# Patient Record
Sex: Female | Born: 1940 | Race: White | Hispanic: No
Health system: Southern US, Community
[De-identification: ages and names within clinical notes are randomized; demographics above are authoritative.]

## PROBLEM LIST (undated history)

## (undated) DIAGNOSIS — F0390 Unspecified dementia without behavioral disturbance: Secondary | ICD-10-CM

---

## 2008-04-12 ENCOUNTER — Ambulatory Visit: Payer: Self-pay | Admitting: Diagnostic Radiology

## 2008-04-12 ENCOUNTER — Ambulatory Visit (HOSPITAL_BASED_OUTPATIENT_CLINIC_OR_DEPARTMENT_OTHER): Admission: RE | Admit: 2008-04-12 | Discharge: 2008-04-12 | Payer: Self-pay | Admitting: Family Medicine

## 2008-07-12 ENCOUNTER — Emergency Department (HOSPITAL_BASED_OUTPATIENT_CLINIC_OR_DEPARTMENT_OTHER): Admission: EM | Admit: 2008-07-12 | Discharge: 2008-07-12 | Payer: Self-pay | Admitting: Emergency Medicine

## 2008-07-12 ENCOUNTER — Ambulatory Visit: Payer: Self-pay | Admitting: Diagnostic Radiology

## 2009-03-15 ENCOUNTER — Encounter: Admission: RE | Admit: 2009-03-15 | Discharge: 2009-05-14 | Payer: Self-pay | Admitting: Orthopedic Surgery

## 2011-07-28 DIAGNOSIS — H35372 Puckering of macula, left eye: Secondary | ICD-10-CM | POA: Insufficient documentation

## 2013-01-25 ENCOUNTER — Other Ambulatory Visit: Payer: Self-pay | Admitting: Endocrinology

## 2013-01-25 DIAGNOSIS — E041 Nontoxic single thyroid nodule: Secondary | ICD-10-CM

## 2013-02-08 ENCOUNTER — Other Ambulatory Visit (HOSPITAL_COMMUNITY)
Admission: RE | Admit: 2013-02-08 | Discharge: 2013-02-08 | Disposition: A | Discharge status: Home / Self Care | Payer: Medicare Other | Source: Ambulatory Visit | Attending: Interventional Radiology | Admitting: Interventional Radiology

## 2013-02-08 ENCOUNTER — Ambulatory Visit
Admission: RE | Admit: 2013-02-08 | Discharge: 2013-02-08 | Disposition: A | Discharge status: Home / Self Care | Payer: Medicare Other | Source: Ambulatory Visit | Attending: Endocrinology | Admitting: Endocrinology

## 2013-02-08 DIAGNOSIS — E041 Nontoxic single thyroid nodule: Secondary | ICD-10-CM | POA: Insufficient documentation

## 2013-05-10 DIAGNOSIS — M1712 Unilateral primary osteoarthritis, left knee: Secondary | ICD-10-CM | POA: Insufficient documentation

## 2013-06-16 ENCOUNTER — Ambulatory Visit: Payer: Medicare Other | Attending: Orthopaedic Surgery | Admitting: Physical Therapy

## 2013-06-16 DIAGNOSIS — I10 Essential (primary) hypertension: Secondary | ICD-10-CM | POA: Insufficient documentation

## 2013-06-16 DIAGNOSIS — IMO0001 Reserved for inherently not codable concepts without codable children: Secondary | ICD-10-CM | POA: Insufficient documentation

## 2013-06-16 DIAGNOSIS — M810 Age-related osteoporosis without current pathological fracture: Secondary | ICD-10-CM | POA: Insufficient documentation

## 2013-06-16 DIAGNOSIS — M25569 Pain in unspecified knee: Secondary | ICD-10-CM | POA: Insufficient documentation

## 2013-11-13 DIAGNOSIS — M25552 Pain in left hip: Secondary | ICD-10-CM | POA: Insufficient documentation

## 2013-11-13 DIAGNOSIS — N390 Urinary tract infection, site not specified: Secondary | ICD-10-CM | POA: Insufficient documentation

## 2013-11-13 DIAGNOSIS — F419 Anxiety disorder, unspecified: Secondary | ICD-10-CM | POA: Insufficient documentation

## 2013-11-13 DIAGNOSIS — I10 Essential (primary) hypertension: Secondary | ICD-10-CM | POA: Insufficient documentation

## 2014-03-19 DIAGNOSIS — M5432 Sciatica, left side: Secondary | ICD-10-CM | POA: Insufficient documentation

## 2014-04-17 ENCOUNTER — Ambulatory Visit
Admission: RE | Admit: 2014-04-17 | Discharge: 2014-04-17 | Disposition: A | Discharge status: Home / Self Care | Payer: Medicare Other | Source: Ambulatory Visit | Attending: Physical Medicine and Rehabilitation | Admitting: Physical Medicine and Rehabilitation

## 2014-04-17 ENCOUNTER — Other Ambulatory Visit: Payer: Self-pay | Admitting: Physical Medicine and Rehabilitation

## 2014-04-17 DIAGNOSIS — M542 Cervicalgia: Secondary | ICD-10-CM

## 2014-09-17 DIAGNOSIS — R5383 Other fatigue: Secondary | ICD-10-CM | POA: Insufficient documentation

## 2014-09-17 DIAGNOSIS — F4322 Adjustment disorder with anxiety: Secondary | ICD-10-CM | POA: Insufficient documentation

## 2014-09-17 DIAGNOSIS — N390 Urinary tract infection, site not specified: Secondary | ICD-10-CM | POA: Insufficient documentation

## 2014-10-12 DIAGNOSIS — J300 Vasomotor rhinitis: Secondary | ICD-10-CM | POA: Insufficient documentation

## 2015-01-01 DIAGNOSIS — Z96642 Presence of left artificial hip joint: Secondary | ICD-10-CM | POA: Insufficient documentation

## 2015-05-02 DIAGNOSIS — J301 Allergic rhinitis due to pollen: Secondary | ICD-10-CM | POA: Insufficient documentation

## 2015-06-17 DIAGNOSIS — M810 Age-related osteoporosis without current pathological fracture: Secondary | ICD-10-CM | POA: Insufficient documentation

## 2015-09-02 DIAGNOSIS — R251 Tremor, unspecified: Secondary | ICD-10-CM | POA: Insufficient documentation

## 2015-10-08 DIAGNOSIS — M1711 Unilateral primary osteoarthritis, right knee: Secondary | ICD-10-CM | POA: Insufficient documentation

## 2016-04-20 DIAGNOSIS — M159 Polyosteoarthritis, unspecified: Secondary | ICD-10-CM | POA: Insufficient documentation
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
## 2016-08-18 DIAGNOSIS — R350 Frequency of micturition: Secondary | ICD-10-CM | POA: Insufficient documentation

## 2017-02-02 DIAGNOSIS — N6002 Solitary cyst of left breast: Secondary | ICD-10-CM | POA: Insufficient documentation

## 2017-02-17 DIAGNOSIS — R634 Abnormal weight loss: Secondary | ICD-10-CM | POA: Insufficient documentation

## 2017-02-17 DIAGNOSIS — F330 Major depressive disorder, recurrent, mild: Secondary | ICD-10-CM | POA: Insufficient documentation

## 2017-03-30 DIAGNOSIS — H5461 Unqualified visual loss, right eye, normal vision left eye: Secondary | ICD-10-CM | POA: Insufficient documentation

## 2017-05-03 DIAGNOSIS — I700 Atherosclerosis of aorta: Secondary | ICD-10-CM | POA: Insufficient documentation

## 2017-08-13 DIAGNOSIS — H9190 Unspecified hearing loss, unspecified ear: Secondary | ICD-10-CM | POA: Insufficient documentation

## 2018-12-05 DIAGNOSIS — T8454XA Infection and inflammatory reaction due to internal left knee prosthesis, initial encounter: Secondary | ICD-10-CM | POA: Insufficient documentation

## 2018-12-08 DIAGNOSIS — M00062 Staphylococcal arthritis, left knee: Secondary | ICD-10-CM | POA: Insufficient documentation

## 2019-01-02 DIAGNOSIS — Z96652 Presence of left artificial knee joint: Secondary | ICD-10-CM | POA: Insufficient documentation

## 2019-02-08 DIAGNOSIS — E538 Deficiency of other specified B group vitamins: Secondary | ICD-10-CM | POA: Insufficient documentation

## 2020-12-09 DIAGNOSIS — G301 Alzheimer's disease with late onset: Secondary | ICD-10-CM | POA: Insufficient documentation

## 2020-12-09 DIAGNOSIS — F02B0 Dementia in other diseases classified elsewhere, moderate, without behavioral disturbance, psychotic disturbance, mood disturbance, and anxiety: Secondary | ICD-10-CM | POA: Insufficient documentation

## 2021-04-28 DIAGNOSIS — S72009A Fracture of unspecified part of neck of unspecified femur, initial encounter for closed fracture: Secondary | ICD-10-CM | POA: Insufficient documentation

## 2021-04-29 DIAGNOSIS — N183 Chronic kidney disease, stage 3 unspecified: Secondary | ICD-10-CM | POA: Insufficient documentation

## 2021-06-19 ENCOUNTER — Telehealth: Payer: Self-pay | Admitting: Internal Medicine

## 2021-06-19 NOTE — Telephone Encounter (Signed)
Spoke with patient's son Collier Salina and regarding the Palliative referral/services and all questions were answered and he was in agreement with beginning services.  I have scheduled an In-home Consult for 07/08/21 @ 2:30 PM

## 2021-07-08 ENCOUNTER — Other Ambulatory Visit: Payer: PRIVATE HEALTH INSURANCE | Admitting: Internal Medicine

## 2021-07-08 DIAGNOSIS — R634 Abnormal weight loss: Secondary | ICD-10-CM

## 2021-07-08 DIAGNOSIS — H90A32 Mixed conductive and sensorineural hearing loss, unilateral, left ear with restricted hearing on the contralateral side: Secondary | ICD-10-CM

## 2021-07-08 DIAGNOSIS — F02B4 Dementia in other diseases classified elsewhere, moderate, with anxiety: Secondary | ICD-10-CM

## 2021-07-08 DIAGNOSIS — H5461 Unqualified visual loss, right eye, normal vision left eye: Secondary | ICD-10-CM

## 2021-07-08 DIAGNOSIS — Z515 Encounter for palliative care: Secondary | ICD-10-CM

## 2021-07-08 DIAGNOSIS — G301 Alzheimer's disease with late onset: Secondary | ICD-10-CM

## 2021-07-08 NOTE — Progress Notes (Signed)
[facility] Consult Note Telephone: [phone]  Fax: [phone]   Date of encounter: 07/17/21 7:33 AM PATIENT NAME: Diana Horton [address] [place] [address]   [phone] (home)  DOB: 1940/10/02 MRN: [id number] PRIMARY CARE PROVIDER:    No primary care provider on file.,  No primary provider on file. None  REFERRING PROVIDER:   Veneda Melter, NP [place] [address],  [place] [address] [phone]  RESPONSIBLE PARTY:    Contact Information     Name Relation Home Work Mobile   [place] Son   [phone]   ?,Diana Horton Peter's partner) Other   [phone]   Diana Horton Sister [phone]          I met face to face with patient and family in her home/facility. Palliative Care was asked to follow this patient by consultation request of  Diana Horton, Diana Hawthorne, NP to address advance care planning and complex medical decision making. This is the initial visit.                                     ASSESSMENT AND PLAN / RECOMMENDATIONS:   Advance Care Planning/Goals of Care: Goals include to maximize quality of life and symptom management. Patient/health care surrogate gave his/her permission to discuss.Our advance care planning conversation included a discussion about:    The value and importance of advance care planning  Experiences with loved ones who have been seriously ill or have died  Exploration of personal, cultural or spiritual beliefs that might influence medical decisions  Exploration of goals of care in the event of a sudden injury or illness  Identification  of a healthcare agent  Review and updating or creation of an  advance directive document . Decision not to resuscitate or to de-escalate disease focused treatments due to poor prognosis. CODE STATUS: Loves her cats--has 5 of them.  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   It's  become clear that she cannot stay at home any longer, but the family has tried to keep her home as long as possible.  There's a spot [facility] [employer], but up front money was tied up--has to sell house to do this.  Waiting on final approval for a personal loan instead.  Symptom Management/Plan: 1. 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 Vision loss, right eye -notable issue as well, these sensory impairments also are negatively impacting her cognition  4. Weight loss -doing better with more help in home and meals prepped, should thrive in a facility with three meals per day to go to and snacks  5. Palliative care by specialist -provided education about palliative care role, hospice role -provided support for DIL and explained we can follow pt wherever she is to provide ongoing education  and support -has HCPOA (son) -DIL reported that pt has DNR and MOST already done with PCP, but these were not accessible so not helpful in emergency -these also will need to be provided to faclity  Follow up Palliative Care Visit: Palliative care will continue to follow for complex medical decision making, advance care planning, and clarification of goals. Return at facility so family to notify us of move so follow-up can be scheduled at [facility].  I spent 75 minutes providing this consultation. More than 50% of the time in this  consultation was spent in counseling and care coordination.  PPS: 40%  HOSPICE ELIGIBILITY/DIAGNOSIS: not at this time/Alzheimer's or abnormal weight loss if persists  Chief Complaint: initial palliative consult  HISTORY OF PRESENT ILLNESS:  Diana Horton is a 81 y.o. year old female  with moderate to severe dementia, chronic pain, h/o fall with hip fx in her driveway who lives alone in a home in [place] in [place] .   Before she broke her hip, she was going to [facility] memory care two half days per week--enjoyed it after not wanting to go.  She is resistant to do things.  They're trying to find an ideal placement.  HIstory partially obtained from DIL, Diana Horton.  Her son is here for the summer, but is usually teaching in [place].  Diana Horton lives in [place] and comes tues afternoon and evenings.  Sister comes several times per week like bringing meds and meals.  Paid caregivers also come in to be sure she gets breakfast, dinner, meds (they get her lunch together also before they leave).  Family covers the weekend.  She is not permitted to cook--one of the staff reported on her before to social services so stove and oven disconnected.  Others warm her food.  She has fresh fruit and easy things to get for herself like sandwich foods.  She is eating it on her own.    She has lost weight--was 109 lbs this am.  20 lbs over 4 years.  Appetite is not great.  She had lunch at [facility] those days.    She'd fallen in her driveway, was in a snf for rehab.  Then sent home.  Working on transitioning to a facility.  Her son is guardian of person and attorney is guardian of the property.  She's bothered by being bored.  Feels like things are not pleasurable.  Cats keep her going.    She's not unhappy but not deliriously happy.  At some point, she sat on a swing outside, but Diana Horton says it was not today.  Gets up in am on later side, finds something to eat--banana sandwich this am, feeds the cats,    Usually, she sleeps ok.  Still feels sleepy in the am.  They have cameras in the home to watch.  She does take trazodone for rest.  Goes to be at about 10pm.  8-9am sometimes, but might feed cats earlier and go back to bed.    She does not have pain, but reports distress.  Right hip bothers her some, but she's stayed active.  She had her left knee replaced after covid and it got infected.  It was red, warm and she had a fever, had infection cleaned out and now on abx forever.  ID said doing well.  Knees doing ok.    Has a bm each am.  No regular incontinence but has had some accidents.  Uses depends overnight.  Sam gave  them a MOST to complete ahead of time.  No living will was done when she was capable of decision-making.  HCPOA was not properly notarized.    Audiologist has said that her left ear problem was more processing than hearing.  She turns the tv way up.    Pt has been getting help with getting into the shower and then she bathes herself.  Uses bathroom on own and dresses herself.  Pt mostly goes kitchen to bedroom.  There are area rugs that were removed.    She has some class b chf and also CKD.  Has only been getting PT weekly.  Does not get up and move much at all some days.  Will then get stiff.  If loan gets approved, they may try to move her to memory care this weekend.    She does like to draw, color, watch tv.  Cannot read anymore.    History obtained from review of EMR, discussion with primary team, and interview with family, facility staff/caregiver and/or Ms. Dennie Bible.  I reviewed available labs, medications, imaging, studies and related documents from the EMR.  Records reviewed and summarized above.   ROS General: NAD EYES: denies vision changes ENMT: denies dysphagia Cardiovascular: denies chest pain, denies DOE Pulmonary: denies cough, denies increased SOB Abdomen: endorses good appetite but requires reminders and food ready/prepped for the most part except a few  simple items, denies constipation, endorses continence of bowel GU: denies dysuria, endorses continence of urine MSK:  denies increased weakness,  no recent falls reported, uses rollator but forgets it at times Skin: denies rashes or wounds Neurological: reports some hip pain, denies insomnia Psych: Endorses positive mood Heme/lymph/immuno: denies bruises, abnormal bleeding  Physical Exam: Current and past weights:none recent on file Constitutional: NAD General: frail appearing, thin EYES: anicteric sclera, lids intact, no discharge  ENMT: very hard of hearing with missing hearing aids, oral mucous membranes moist, dentition intact CV: S1S2, RRR, no LE edema Pulmonary: LCTA, no increased work of breathing, no cough, room air Abdomen: intake 50%, normo-active BS + 4 quadrants, soft and non tender, no ascites GU: deferred MSK: sarcopenia, moves all extremities, ambulatory with rollator Skin: warm and dry, no rashes or wounds on visible skin Neuro:  generalized weakness,  cognitive impairment with severe short-term memory loss--minute to minute and excellent recall of past--obsessing over relationship with sister today Psych: non-anxious affect, A and O x 2--to people and place, not time, knows president Hem/lymph/immuno: no widespread bruising  CURRENT PROBLEM LIST:  Patient Active Problem List   Diagnosis Date Noted   Fall 07/17/2021   CKD (chronic kidney disease), stage III ([place]) 04/29/2021   Closed fracture of hip ([place]) 04/28/2021   Moderate late onset Alzheimer's dementia ([place]) 16/10/9602   Folic acid deficiency 54/09/8117   Vitamin B12 deficiency 02/08/2019   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/02/2019   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[place]) 12/08/2018   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[place]) 12/05/2018   Hearing loss 08/13/2017   Aortic atherosclerosis ([place]) 05/03/2017   Vision loss, right eye 03/30/2017   Mild episode of recurrent major depressive disorder  ([place]) 02/17/2017   Weight loss 02/17/2017   Breast cyst, left 02/02/2017   Increased frequency of urination 08/18/2016   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   Primary osteoarthritis of right knee 10/08/2015   Tremor 09/02/2015   Osteoporosis 06/17/2015   Seasonal allergic rhinitis due to pollen 05/02/2015   History of total left hip  replacement 01/01/2015   Vasomotor rhinitis 10/12/2014   Adjustment disorder with anxious mood 09/17/2014   Fatigue 09/17/2014   Recurrent UTI 09/17/2014   Sciatica of left side 03/19/2014   Chronic anxiety 11/13/2013   Arthralgia of left hip 11/13/2013   Essential hypertension 11/13/2013   Lower urinary tract infectious disease 11/13/2013   Degenerative arthritis of left knee 05/10/2013   Epiretinal membrane, 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/28/2011   PAST MEDICAL HISTORY:  Active Ambulatory Problems    Diagnosis Date Noted   Closed fracture of hip ([place]) 04/28/2021   CKD (chronic kidney disease), stage III ([place]) 04/29/2021   Chronic anxiety 11/13/2013   Breast cyst, left 02/02/2017   Arthralgia of left hip 11/13/2013   Aortic atherosclerosis ([place]) 05/03/2017   Adjustment disorder with anxious mood 09/17/2014   Degenerative arthritis of left knee 05/10/2013   Primary osteoarthritis of right knee 10/08/2015   Epiretinal membrane, left eye 07/28/2011   Essential hypertension 11/13/2013   Fall 07/17/2021   Fatigue 16/10/9602   Folic acid deficiency 54/09/8117   Hearing loss 08/13/2017   History of total left hip replacement 01/01/2015   Increased frequency of urination 08/18/2016   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[place]) 12/05/2018   Lower urinary tract infectious disease 11/13/2013   Moderate late onset Alzheimer's dementia ([place]) 12/09/2020   Mild episode of recurrent major depressive disorder ([place]) 02/17/2017   Osteoporosis 06/17/2015   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   Recurrent UTI 09/17/2014    Sciatica of left side 03/19/2014   Seasonal allergic rhinitis due to pollen 05/02/2015   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    ALLERGIES:  Allergies  Allergen Reactions   Other     age     PERTINENT MEDICATIONS:  Outpatient Encounter Medications as of 07/08/2021  Medication Sig   alendronate (FOSAMAX) 70 MG tablet Take by mouth.   cyanocobalamin 1000 MCG tablet Take 1 tablet by mouth daily.   doxycycline (VIBRAMYCIN) 100 MG capsule Take by mouth.   folic acid (FOLVITE) 1 MG tablet Take by mouth.   ipratropium (ATROVENT) 0.06 % nasal spray    memantine (NAMENDA) 5 MG tablet Take by mouth.   traZODone (DESYREL) 50 MG tablet Take by mouth.   acetaminophen (TYLENOL) 500 MG tablet Take by mouth.   cholecalciferol (VITAMIN D) 25 MCG (1000 UNIT) tablet Take by mouth.   LORazepam (ATIVAN) 0.5 MG tablet Take 0.5 mg by mouth every 8 (eight) hours as needed.   No facility-administered encounter medications on file as of 07/08/2021.   Thank you for the opportunity to participate in the care of Ms. Dennie Bible.  The palliative care team will continue to follow. Please call our office at [phone] if we can be of additional assistance.   Hollace Kinnier, DO   COVID-19 PATIENT SCREENING TOOL Asked and negative response unless otherwise noted:  Have you had  symptoms of covid, tested positive or been in contact with someone with symptoms/positive test in the  past 5-10 days? no

## 2021-07-17 ENCOUNTER — Encounter: Payer: Self-pay | Admitting: Internal Medicine

## 2021-07-17 DIAGNOSIS — W19XXXA Unspecified fall, initial encounter: Secondary | ICD-10-CM | POA: Insufficient documentation

## 2021-08-04 ENCOUNTER — Other Ambulatory Visit: Payer: Self-pay

## 2021-08-04 ENCOUNTER — Emergency Department (HOSPITAL_COMMUNITY)
Admission: EM | Admit: 2021-08-04 | Discharge: 2021-08-04 | Disposition: A | Discharge status: Home / Self Care | Payer: Medicare HMO | Attending: Emergency Medicine | Admitting: Emergency Medicine

## 2021-08-04 ENCOUNTER — Emergency Department (HOSPITAL_COMMUNITY): Payer: Medicare HMO

## 2021-08-04 ENCOUNTER — Encounter (HOSPITAL_COMMUNITY): Payer: Self-pay

## 2021-08-04 DIAGNOSIS — M25552 Pain in left hip: Secondary | ICD-10-CM | POA: Diagnosis not present

## 2021-08-04 DIAGNOSIS — N183 Chronic kidney disease, stage 3 unspecified: Secondary | ICD-10-CM | POA: Diagnosis not present

## 2021-08-04 DIAGNOSIS — W19XXXA Unspecified fall, initial encounter: Secondary | ICD-10-CM

## 2021-08-04 DIAGNOSIS — R519 Headache, unspecified: Secondary | ICD-10-CM | POA: Diagnosis not present

## 2021-08-04 DIAGNOSIS — G309 Alzheimer's disease, unspecified: Secondary | ICD-10-CM | POA: Insufficient documentation

## 2021-08-04 DIAGNOSIS — I129 Hypertensive chronic kidney disease with stage 1 through stage 4 chronic kidney disease, or unspecified chronic kidney disease: Secondary | ICD-10-CM | POA: Insufficient documentation

## 2021-08-04 DIAGNOSIS — E041 Nontoxic single thyroid nodule: Secondary | ICD-10-CM

## 2021-08-04 DIAGNOSIS — W01190A Fall on same level from slipping, tripping and stumbling with subsequent striking against furniture, initial encounter: Secondary | ICD-10-CM | POA: Diagnosis not present

## 2021-08-04 NOTE — ED Triage Notes (Signed)
Per reports handed to this RN by EMS patient doesn't appear to be taking blood thinners

## 2021-08-04 NOTE — ED Triage Notes (Signed)
Patient comes from [facility] altered at baseline per EMS. Patient was a witnessed fall, hit head on couch,no reported LOC. EMS reports that the patient is repetitive. No obvious bleeding or deformities. Per EMS vitals are WNL. Patient normally walks with a walker and EMS reports that she tripped over her foot.

## 2021-08-04 NOTE — ED Notes (Signed)
Patient transported to X-ray 

## 2021-08-04 NOTE — Discharge Instructions (Addendum)
We discussed your diagnosis of fall. We discussed the incidental right thyroid lobe nodule. Radiology recommends getting an ultrasound of the thyroid. Please follow-up with your primary care doctor or at the [facility] if you do not currently have a primary care doctor.  Please return to the ED if you develop new or worsening symptoms including but not limited to headache, neck pain, hip pain.

## 2021-08-04 NOTE — ED Notes (Signed)
Patient assisted to the restroom at this time

## 2021-08-04 NOTE — ED Provider Notes (Signed)
[facility] EMERGENCY DEPARTMENT Provider Note   CSN: [id number] Arrival date & time: 08/04/21  1928     History  Chief Complaint  Patient presents with   Diana Horton    Diana Horton is a 81 y.o. female with history of Alzheimer's, osteoporosis, arthritis, hypertension, CKD stage III presents after a witnessed ground-level fall at her nursing facility. Per EMS, the patient tripped while walking, hit her head on the couch, did not lose consciousness, and has no visible bleeding or deformities.  The patient states that she is unsure of the direction of her fall. She is unsure if she lost consciousness.  The patient is complaining of mild pain in her left parietal region and states that she can feel a bruise developing there.  Patient is also complaining of new mild pain in her left hip.  Patient denies having pain in her neck, shoulders, back, chest, abdomen, right hip, or bilateral lower extremities.  The patient is considerably hard of hearing. Patients records do not list any blood thinners.   The history is provided by the patient and the EMS personnel.  Fall Associated symptoms include headaches. Pertinent negatives include no chest pain and no abdominal pain.       Home Medications Prior to Admission medications   Medication Sig Start Date End Date Taking? Authorizing Provider  acetaminophen (TYLENOL) 500 MG tablet Take by mouth.    [provider]  alendronate (FOSAMAX) 70 MG tablet Take by mouth. 05/15/20   [provider]  cholecalciferol (VITAMIN D) 25 MCG (1000 UNIT) tablet Take by mouth.    [provider]  cyanocobalamin 1000 MCG tablet Take 1 tablet by mouth daily. 05/09/19   [provider]  doxycycline (VIBRAMYCIN) 100 MG capsule Take by mouth. 06/24/21   [provider]  folic acid (FOLVITE) 1 MG tablet Take by mouth. 01/16/20   [provider]  ipratropium (ATROVENT) 0.06 % nasal spray  06/20/15    [provider]  LORazepam (ATIVAN) 0.5 MG tablet Take 0.5 mg by mouth every 8 (eight) hours as needed. 05/26/21   [provider]  memantine (NAMENDA) 5 MG tablet Take by mouth. 03/31/21   [provider]  traZODone (DESYREL) 50 MG tablet Take by mouth. 06/09/20   [provider]      Allergies    Other    Review of Systems   Review of Systems  Cardiovascular:  Negative for chest pain.  Gastrointestinal:  Negative for abdominal pain.  Musculoskeletal:  Negative for back pain and neck pain.       Left hip pain.   Neurological:  Positive for headaches. Negative for syncope.    Physical Exam Updated Vital Signs BP (!) 155/65 (BP Location: Right Arm)   Pulse 84   Temp 98 F (36.7 C)   Resp 17   SpO2 100%  Physical Exam HENT:     Head:     Comments: Tenderness to palpation of the scalp in the left parietal region. Bruising of the scalp in the left parietal region without active bleeding. No laceration or open fracture. No deformity.  Eyes:     Extraocular Movements: Extraocular movements intact.     Pupils: Pupils are equal, round, and reactive to light.  Cardiovascular:     Rate and Rhythm: Normal rate and regular rhythm.     Pulses: Normal pulses.     Heart sounds: No murmur heard.    No friction rub. No gallop.  Pulmonary:     Effort: Pulmonary effort is normal.     Breath sounds: No wheezing, rhonchi or rales.  Musculoskeletal:     Cervical back: Normal range of motion. No tenderness.     Comments: Tenderness to palpation of the left hip. No deformities. No tenderness to palpation of bilateral UE or LE. No midline spinal tenderness. No tenderness to palpation of bilateral paraspinal regions.   Neurological:     Mental Status: She is alert.     ED Results / Procedures / Treatments   Labs (all labs ordered are listed, but only abnormal results are displayed) Labs Reviewed - No data to display  EKG None  Radiology No results  found.  Procedures Procedures    Medications Ordered in ED Medications - No data to display  ED Course/ Medical Decision Making/ A&P                           Medical Decision Making Diana Horton is a 81 y.o. female with history of Alzheimer's, osteoporosis, arthritis, hypertension, CKD stage III presents after a witnessed ground-level fall at her nursing facility. Per EMS, the patient did hit her head but did not lose consciousness.  The patient is considerably hard of hearing but was able to answer my questions and tell me where she was hurting. Differential diagnosis includes but is not limited to fracture, hematoma, bruise.  Will order CT of the head and C-spine to assess for acute abnormalities.  Will order x-ray of the left hip to assess for acute abnormalities. Patient discussed with Dr. Stevie Kern.  Amount and/or Complexity of Data Reviewed Radiology: ordered and independent interpretation performed. Decision-making details documented in ED Course.   9:55 PM  Patient's son is here.  The son confirms that the patient is not currently taking blood thinners.  Updated the patient and her son on the plan for imaging.  The patient states that her head pain and hip pain are still mild.  10:00 PM The patient's hip XRAY is negative for acute abnormalities.  Awaiting results of the head CT and CT spine.  11:03 PM  The patients head CT and CT spine are negative for acute abnormalities. CT  of the head/c-spine demonstrates an incidental nodule in the right lobe of the thyroid. Radiologist recommends obtaining an US of the thyroid to follow up. Updated the patient and son on results of imaging. Discussed plan to follow-up with her primary care doctor for her fall and for the thyroid nodule.  Discussed plan to return to the ED should she develop new or worsening symptoms including but not limited to headache, neck pain, hip pain.  Patient and son agree with the plan.        Final  Clinical Impression(s) / ED Diagnoses Final diagnoses:  None    Rx / DC Orders ED Discharge Orders     None         Konnor Jorden, Gaylyn Cheers, MD 08/04/21 2313    Milagros Loll, MD 08/04/21 [phone]

## 2021-09-13 ENCOUNTER — Encounter (HOSPITAL_COMMUNITY): Payer: Self-pay | Admitting: Emergency Medicine

## 2021-09-13 ENCOUNTER — Emergency Department (HOSPITAL_COMMUNITY): Payer: Medicare HMO

## 2021-09-13 ENCOUNTER — Emergency Department (HOSPITAL_COMMUNITY)
Admission: EM | Admit: 2021-09-13 | Discharge: 2021-09-13 | Disposition: A | Discharge status: Home / Self Care | Payer: Medicare HMO | Attending: Emergency Medicine | Admitting: Emergency Medicine

## 2021-09-13 ENCOUNTER — Other Ambulatory Visit: Payer: Self-pay

## 2021-09-13 DIAGNOSIS — W06XXXA Fall from bed, initial encounter: Secondary | ICD-10-CM | POA: Insufficient documentation

## 2021-09-13 DIAGNOSIS — I129 Hypertensive chronic kidney disease with stage 1 through stage 4 chronic kidney disease, or unspecified chronic kidney disease: Secondary | ICD-10-CM | POA: Diagnosis not present

## 2021-09-13 DIAGNOSIS — N390 Urinary tract infection, site not specified: Secondary | ICD-10-CM

## 2021-09-13 DIAGNOSIS — S0990XA Unspecified injury of head, initial encounter: Secondary | ICD-10-CM | POA: Diagnosis present

## 2021-09-13 DIAGNOSIS — S0181XA Laceration without foreign body of other part of head, initial encounter: Secondary | ICD-10-CM | POA: Diagnosis not present

## 2021-09-13 DIAGNOSIS — N189 Chronic kidney disease, unspecified: Secondary | ICD-10-CM | POA: Insufficient documentation

## 2021-09-13 DIAGNOSIS — F0390 Unspecified dementia without behavioral disturbance: Secondary | ICD-10-CM | POA: Diagnosis not present

## 2021-09-13 DIAGNOSIS — Z23 Encounter for immunization: Secondary | ICD-10-CM | POA: Insufficient documentation

## 2021-09-13 DIAGNOSIS — E876 Hypokalemia: Secondary | ICD-10-CM | POA: Insufficient documentation

## 2021-09-13 DIAGNOSIS — M542 Cervicalgia: Secondary | ICD-10-CM | POA: Insufficient documentation

## 2021-09-13 DIAGNOSIS — W19XXXA Unspecified fall, initial encounter: Secondary | ICD-10-CM

## 2021-09-13 LAB — CBC WITH DIFFERENTIAL/PLATELET
Abs Immature Granulocytes: 0.05 10*3/uL (ref 0.00–0.07)
Basophils Absolute: 0 10*3/uL (ref 0.0–0.1)
Basophils Relative: 0 %
Eosinophils Absolute: 0.1 10*3/uL (ref 0.0–0.5)
Eosinophils Relative: 1 %
HCT: 34.4 % — ABNORMAL LOW (ref 36.0–46.0)
Hemoglobin: 11.1 g/dL — ABNORMAL LOW (ref 12.0–15.0)
Immature Granulocytes: 1 %
Lymphocytes Relative: 11 %
Lymphs Abs: 0.7 10*3/uL (ref 0.7–4.0)
MCH: 29.3 pg (ref 26.0–34.0)
MCHC: 32.3 g/dL (ref 30.0–36.0)
MCV: 90.8 fL (ref 80.0–100.0)
Monocytes Absolute: 0.7 10*3/uL (ref 0.1–1.0)
Monocytes Relative: 11 %
Neutro Abs: 5 10*3/uL (ref 1.7–7.7)
Neutrophils Relative %: 76 %
Platelets: 215 10*3/uL (ref 150–400)
RBC: 3.79 MIL/uL — ABNORMAL LOW (ref 3.87–5.11)
RDW: 16.2 % — ABNORMAL HIGH (ref 11.5–15.5)
WBC: 6.6 10*3/uL (ref 4.0–10.5)
nRBC: 0 % (ref 0.0–0.2)

## 2021-09-13 LAB — URINALYSIS, ROUTINE W REFLEX MICROSCOPIC
Bacteria, UA: NONE SEEN
Bilirubin Urine: NEGATIVE
Glucose, UA: 50 mg/dL — AB
Ketones, ur: NEGATIVE mg/dL
Nitrite: NEGATIVE
Protein, ur: 30 mg/dL — AB
Specific Gravity, Urine: 1.013 (ref 1.005–1.030)
pH: 6 (ref 5.0–8.0)

## 2021-09-13 LAB — COMPREHENSIVE METABOLIC PANEL
ALT: 13 U/L (ref 0–44)
AST: 18 U/L (ref 15–41)
Albumin: 4.1 g/dL (ref 3.5–5.0)
Alkaline Phosphatase: 68 U/L (ref 38–126)
Anion gap: 8 (ref 5–15)
BUN: 19 mg/dL (ref 8–23)
CO2: 25 mmol/L (ref 22–32)
Calcium: 9.3 mg/dL (ref 8.9–10.3)
Chloride: 104 mmol/L (ref 98–111)
Creatinine, Ser: 1.13 mg/dL — ABNORMAL HIGH (ref 0.44–1.00)
GFR, Estimated: 49 mL/min — ABNORMAL LOW (ref >60)
Glucose, Bld: 108 mg/dL — ABNORMAL HIGH (ref 70–99)
Potassium: 2.8 mmol/L — ABNORMAL LOW (ref 3.5–5.1)
Sodium: 137 mmol/L (ref 135–145)
Total Bilirubin: 0.8 mg/dL (ref 0.3–1.2)
Total Protein: 6.8 g/dL (ref 6.5–8.1)

## 2021-09-13 MED ORDER — LIDOCAINE-EPINEPHRINE (PF) 2 %-1:200000 IJ SOLN
10.0000 mL | Freq: Once | INTRAMUSCULAR | Status: AC
  Administered 2021-09-13: 10 mL
  Filled 2021-09-13: qty 20

## 2021-09-13 MED ORDER — TETANUS-DIPHTH-ACELL PERTUSSIS 5-2.5-18.5 LF-MCG/0.5 IM SUSY
0.5000 mL | PREFILLED_SYRINGE | Freq: Once | INTRAMUSCULAR | Status: AC
Start: 2021-09-13 — End: 2021-09-13
  Administered 2021-09-13: 0.5 mL via INTRAMUSCULAR
  Filled 2021-09-13: qty 0.5

## 2021-09-13 MED ORDER — POTASSIUM CHLORIDE CRYS ER 20 MEQ PO TBCR
40.0000 meq | EXTENDED_RELEASE_TABLET | Freq: Once | ORAL | Status: AC
  Administered 2021-09-13: 40 meq via ORAL
  Filled 2021-09-13: qty 2

## 2021-09-13 MED ORDER — ACETAMINOPHEN 325 MG PO TABS
650.0000 mg | ORAL_TABLET | Freq: Once | ORAL | Status: AC
  Administered 2021-09-13: 650 mg via ORAL
  Filled 2021-09-13: qty 2

## 2021-09-13 MED ORDER — POTASSIUM CHLORIDE CRYS ER 20 MEQ PO TBCR: 20.0000 meq | EXTENDED_RELEASE_TABLET | Freq: Every day | ORAL | 0 refills | Status: DC

## 2021-09-13 MED ORDER — LIDOCAINE-EPINEPHRINE-TETRACAINE (LET) TOPICAL GEL
3.0000 mL | Freq: Once | TOPICAL | Status: AC
  Administered 2021-09-13: 3 mL via TOPICAL
  Filled 2021-09-13: qty 3

## 2021-09-13 MED ORDER — FOSFOMYCIN TROMETHAMINE 3 G PO PACK
3.0000 g | PACK | Freq: Once | ORAL | Status: AC
Start: 2021-09-13 — End: 2021-09-13
  Administered 2021-09-13: 3 g via ORAL
  Filled 2021-09-13: qty 3

## 2021-09-13 MED ORDER — IBUPROFEN 200 MG PO TABS
400.0000 mg | ORAL_TABLET | Freq: Once | ORAL | Status: AC
  Administered 2021-09-13: 400 mg via ORAL
  Filled 2021-09-13: qty 2

## 2021-09-13 NOTE — ED Provider Notes (Signed)
[facility]-EMERGENCY DEPT Provider Note   CSN: [id number] Arrival date & time: 09/13/21  0041     History  Chief Complaint  Patient presents with   Diana Horton    Diana Horton is a 81 y.o. female.  The history is provided by the patient, the EMS personnel and medical records.  Fall  Diana Horton is a 81 y.o. female who presents to the Emergency Department complaining of fall.  She presents to the emergency department for evaluation of injuries after a fall.  She is a resident at [facility] [employer].  She states that she was getting out of bed to use the bathroom and she slipped and fell, striking her head.  Patient is unsure if she lost consciousness or not.  She complains of pain to her head, neck. She has a history of CKD, hypertension, dementia    Home Medications Prior to Admission medications   Medication Sig Start Date End Date Taking? Authorizing Provider  acetaminophen (TYLENOL) 500 MG tablet Take by mouth.    [provider]  alendronate (FOSAMAX) 70 MG tablet Take by mouth. 05/15/20   [provider]  cholecalciferol (VITAMIN D) 25 MCG (1000 UNIT) tablet Take by mouth.    [provider]  cyanocobalamin 1000 MCG tablet Take 1 tablet by mouth daily. 05/09/19   [provider]  doxycycline (VIBRAMYCIN) 100 MG capsule Take by mouth. 06/24/21   [provider]  folic acid (FOLVITE) 1 MG tablet Take by mouth. 01/16/20   [provider]  ipratropium (ATROVENT) 0.06 % nasal spray  06/20/15   [provider]  LORazepam (ATIVAN) 0.5 MG tablet Take 0.5 mg by mouth every 8 (eight) hours as needed. 05/26/21   [provider]  memantine (NAMENDA) 5 MG tablet Take by mouth. 03/31/21   [provider]  traZODone (DESYREL) 50 MG tablet Take by mouth. 06/09/20   [provider]      Allergies    Other    Review of Systems   Review of Systems  All other systems reviewed and are  negative.   Physical Exam Updated Vital Signs BP (!) 140/93   Pulse 84   Temp (!) 97.5 F (36.4 C) (Oral)   Resp 18   Ht 5\' 2"  (1.575 m)   Wt 43.1 kg   SpO2 98%   BMI 17.38 kg/m  Physical Exam Vitals and nursing note reviewed.  Constitutional:      Appearance: She is well-developed.  HENT:     Head: Normocephalic.     Comments: There is a laceration to the central forehead.  Pupils equal round and reactive, EOMI. Neck:     Comments: No significant midline cervical spine tenderness, c-collar in place. Cardiovascular:     Rate and Rhythm: Normal rate and regular rhythm.     Heart sounds: No murmur heard. Pulmonary:     Effort: Pulmonary effort is normal. No respiratory distress.     Breath sounds: Normal breath sounds.  Abdominal:     Palpations: Abdomen is soft.     Tenderness: There is no abdominal tenderness. There is no guarding or rebound.  Musculoskeletal:     Comments: No significant tenderness to palpation over the hips bilaterally.  There is mild tenderness to palpation over the left knee with flexion extension intact.  Skin:    General: Skin is warm and dry.  Neurological:     Mental Status: She is alert.     Comments:  Oriented to person, place.  Disoriented to year.  5 out of 5 strength in all 4 extremities  Psychiatric:        Behavior: Behavior normal.     ED Results / Procedures / Treatments   Labs (all labs ordered are listed, but only abnormal results are displayed) Labs Reviewed  COMPREHENSIVE METABOLIC PANEL - Abnormal; Notable for the following components:      Result Value   Potassium 2.8 (*)    Glucose, Bld 108 (*)    Creatinine, Ser 1.13 (*)    GFR, Estimated 49 (*)    All other components within normal limits  CBC WITH DIFFERENTIAL/PLATELET - Abnormal; Notable for the following components:   RBC 3.79 (*)    Hemoglobin 11.1 (*)    HCT 34.4 (*)    RDW 16.2 (*)    All other components within normal limits  URINALYSIS, ROUTINE W REFLEX  MICROSCOPIC - Abnormal; Notable for the following components:   Glucose, UA 50 (*)    Hgb urine dipstick SMALL (*)    Protein, ur 30 (*)    Leukocytes,Ua LARGE (*)    All other components within normal limits    EKG EKG Interpretation  Date/Time:  Saturday September 13 2021 01:19:30 EDT Ventricular Rate:  81 PR Interval:  136 QRS Duration: 93 QT Interval:  428 QTC Calculation: 497 R Axis:   -34 Text Interpretation: Sinus rhythm Atrial premature complexes RSR' in V1 or V2, probably normal variant Probable LVH with secondary repol abnrm Borderline prolonged QT interval Confirmed by Tilden Fossa [id number]) on 09/13/2021 2:03:36 AM  Radiology DG Knee Complete 4 Views Left  Result Date: 09/13/2021 CLINICAL DATA:  Status post fall. EXAM: LEFT KNEE - COMPLETE 4+ VIEW COMPARISON:  None Available. 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 No evidence of an acute fracture or dislocation. Soft tissues are unremarkable. 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 Electronically Signed   By: Aram Candela M.D.   On: 09/13/2021 02:18   DG Chest Port 1 View  Result Date: 09/13/2021 CLINICAL DATA:  Status post fall. EXAM: PORTABLE CHEST 1 VIEW COMPARISON:  November 09, 2018 FINDINGS: The heart size and mediastinal contours are within normal limits. There is mild calcification of the aortic arch and tortuosity of the descending thoracic aorta. Low lung volumes are seen. There is no evidence of acute infiltrate, pleural effusion or pneumothorax. Multilevel degenerative changes seen within the mid and lower thoracic spine with moderate severity dextroscoliosis of the lower thoracic and upper lumbar spine. IMPRESSION: Low lung volumes without acute or active cardiopulmonary disease. Electronically Signed   By: Aram Candela M.D.   On: 09/13/2021 02:16   CT  Cervical Spine Wo Contrast  Result Date: 09/13/2021 CLINICAL DATA:  Status post fall. EXAM: CT CERVICAL SPINE WITHOUT CONTRAST TECHNIQUE: Multidetector CT imaging of the cervical spine was performed without intravenous contrast. Multiplanar CT image reconstructions were also generated. RADIATION DOSE REDUCTION: This exam was performed according to the departmental dose-optimization program which includes automated exposure control, adjustment of the mA and/or kV according to patient size and/or use of iterative reconstruction technique. COMPARISON:  August 04, 2021 FINDINGS: Alignment: Normal. Skull base and vertebrae: No acute fracture. No primary bone lesion or focal pathologic process. Soft tissues and spinal canal: No prevertebral fluid or swelling. No visible canal hematoma. Disc levels: Marked severity endplate sclerosis and  marked severity anterior osteophyte formation are seen at the levels of C4-C5, C5-C6 and C6-C7. There is marked severity narrowing of the anterior atlantoaxial articulation. Marked severity intervertebral disc space narrowing is seen at C4-C5, C5-C6 and C6-C7. Mild intervertebral disc space narrowing and mild vacuum disc phenomenon is seen at C3-C4. Bilateral marked severity multilevel facet joint hypertrophy is noted. Upper chest: There is mild biapical scarring and/or atelectasis. Other: None. IMPRESSION: 1. No acute fracture or subluxation of the cervical spine. 2. Marked severity multilevel degenerative changes, as described above. Electronically Signed   By: Aram Candela M.D.   On: 09/13/2021 02:15   CT Head Wo Contrast  Result Date: 09/13/2021 CLINICAL DATA:  Status post fall. EXAM: CT HEAD WITHOUT CONTRAST TECHNIQUE: Contiguous axial images were obtained from the base of the skull through the vertex without intravenous contrast. RADIATION DOSE REDUCTION: This exam was performed according to the departmental dose-optimization program which includes automated exposure  control, adjustment of the mA and/or kV according to patient size and/or use of iterative reconstruction technique. COMPARISON:  August 04, 2021 FINDINGS: Brain: There is mild cerebral atrophy with widening of the extra-axial spaces and ventricular dilatation. There are areas of decreased attenuation within the white matter tracts of the supratentorial brain, consistent with microvascular disease changes. Vascular: No hyperdense vessel or unexpected calcification. Skull: Normal. Negative for fracture or focal lesion. Sinuses/Orbits: No acute finding. Other: There is mild right frontal scalp soft tissue swelling. IMPRESSION: 1. Generalized cerebral atrophy and chronic white matter small vessel ischemic changes without evidence of an acute intracranial abnormality. 2. Mild right frontal scalp soft tissue swelling without evidence of an underlying fracture. Electronically Signed   By: Aram Candela M.D.   On: 09/13/2021 02:12    Procedures Procedures    Medications Ordered in ED Medications  lidocaine-EPINEPHrine-tetracaine (LET) topical gel (3 mLs Topical Given 09/13/21 0250)  lidocaine-EPINEPHrine (XYLOCAINE W/EPI) 2 %-1:200000 (PF) injection 10 mL (10 mLs Infiltration Given by Other 09/13/21 0430)  potassium chloride SA (KLOR-CON M) CR tablet 40 mEq (40 mEq Oral Given 09/13/21 0449)  acetaminophen (TYLENOL) tablet 650 mg (650 mg Oral Given 09/13/21 9379)    ED Course/ Medical Decision Making/ A&P                           Medical Decision Making Amount and/or Complexity of Data Reviewed Labs: ordered. Radiology: ordered.  Risk OTC drugs. Prescription drug management.   Patient here for evaluation of injuries following a fall.  She has a laceration to her forehead-wound repaired per PA note.  CT head is negative for acute abnormality.  CT scan C-spine with severe degenerative changes, no acute fracture or dislocation.  Patient does complain of ongoing neck pain.  Will place in soft collar and  obtain MRI to further evaluate for additional injuries.  CBC with mild anemia.  CMP with stable renal function.  She does have mild hypokalemia-replaced orally.  patient care transferred pending MRI.  Attempted to contact patient's son for an update over the phone-no answer.        Final Clinical Impression(s) / ED Diagnoses Final diagnoses:  Facial laceration, initial encounter  Fall, initial encounter  Neck pain  Hypokalemia    Rx / DC Orders ED Discharge Orders     None         Tilden Fossa, MD 09/13/21 [phone]

## 2021-09-13 NOTE — ED Triage Notes (Signed)
Pt BIB EMS for unwitnessed fall. Pt denies LOC. Per pt, she slipped since she had socks and no shoes on. Pt has lac on her head. Pt c/o neck pain as well.

## 2021-09-13 NOTE — ED Provider Notes (Addendum)
  Physical Exam  BP (!) 140/93   Pulse 84   Temp (!) 97.5 F (36.4 C) (Oral)   Resp 18   Ht 5\' 2"  (1.575 m)   Wt 43.1 kg   SpO2 98%   BMI 17.38 kg/m   Physical Exam  Procedures  Procedures  ED Course / MDM    Medical Decision Making Amount and/or Complexity of Data Reviewed Labs: ordered. Radiology: ordered.  Risk OTC drugs. Prescription drug management.   81 year old female with severe dementia who presents after a fall.  She had a head injury with resulting frontal forehead laceration that was repaired by APP.  Mild hypokalemia 2.8 which was repleted with 40 mEq potassium chloride.  Mild anemia hemoglobin 11.1 with no evidence of bleeding.  UA with 11-20 WBCs and large leukocyte esterase and small hemoglobin, will treat as UTI and give dose of fosfomycin.  Patient had MRI C-spine due to persistent neck pain but no evidence of acute soft tissue injury or cord abnormality just chronic severe cervical stenosis.  She is moving bilateral upper extremities and able to walk but had a limp.  Knee x-ray of the left negative, chest x-ray unremarkable, CT head and C-spine unremarkable.  Will obtain pelvis x-ray due to limp favoring right side.  Pelvis x-ray obtained with no evidence of acute traumatic injury, independently interpreted myself.  I have visualized patient bearing weight on bilateral lower extremities.  No concern for occult fracture. Will discharge with strict return precautions back to facility and short course of potassium.         77, MD 09/13/21 1010    09/15/21, MD 09/13/21 1011

## 2021-09-13 NOTE — ED Notes (Signed)
Assumed pt care with RN oversight, pt respirations equal & unlabored.

## 2021-09-13 NOTE — ED Provider Notes (Signed)
Please see Dr. Brunilda Payor note for full H&P.  Forehead laceration repaired per procedure note below. Procedures  .Marland KitchenLaceration Repair  Date/Time: 09/13/2021 4:41 AM  Performed by: Cherly Anderson, PA-C Authorized by: Cherly Anderson, PA-C   Consent:    Consent obtained:  Verbal   Consent given by:  Patient   Risks, benefits, and alternatives were discussed: yes     Alternatives discussed:  No treatment Anesthesia:    Anesthesia method:  Topical application and local infiltration   Topical anesthetic:  LET   Local anesthetic:  Lidocaine 2% WITH epi Laceration details:    Location:  Scalp   Scalp location:  Frontal   Length (cm):  3   Depth (mm):  4 Pre-procedure details:    Preparation:  Patient was prepped and draped in usual sterile fashion Exploration:    Hemostasis achieved with:  Direct pressure   Contaminated: no   Treatment:    Area cleansed with:  Chlorhexidine   Amount of cleaning:  Standard   Irrigation solution:  Sterile water   Irrigation method:  Syringe   Layers/structures repaired:  Vernona Rieger:    Suture size:  4-0   Suture material:  Vicryl   Suture technique:  Simple interrupted   Number of sutures:  2 Skin repair:    Repair method:  Sutures   Suture size:  5-0   Wound skin closure material used: vicryl rapide.   Suture technique:  Simple interrupted   Number of sutures:  5 Approximation:    Approximation:  Close Repair type:    Repair type:  Intermediate Post-procedure details:    Procedure completion:  Tolerated well, no immediate complications      Cherly Anderson, PA-C 09/13/21 0443    Tilden Fossa, MD 09/14/21 [phone]

## 2021-09-13 NOTE — Discharge Instructions (Signed)
You have been seen in the Emergency Department (ED) today following a fall.  Your workup today did not reveal any injuries that require you to stay in the hospital.  The imaging of your head and neck, pelvis and knee were all reassuring and showed no traumatic injuries.  We also got an MRI of your cervical spine which showed no spinal cord or soft tissue injury.  You can expect to be stiff and sore for the next several days.  Please take Tylenol or Motrin as needed for pain, but only as written on the box.  You did have a UTI which we gave a one-time dose of antibiotics for in the ER.  You should not require any further antibiotics.  Also your potassium was slightly low, take the potassium as prescribed for the next 5 days.  Please follow up with your primary care doctor as soon as possible regarding today's ED visit and your recent accident.   Call your doctor or return to the ED if you develop a sudden or severe headache, confusion, slurred speech, facial droop, weakness or numbness in any arm or leg,  extreme fatigue, vomiting more than two times, severe abdominal pain, difficulty breathing or any other concerning signs or symptoms.

## 2021-09-13 NOTE — ED Notes (Signed)
Pt assisted to bedpan, provided perineal care, new brief applied, repositioned to comfort.

## 2021-09-13 NOTE — ED Notes (Signed)
Pt assisted with bedpan, pt provided perineal care following, barrier ointment applied, new brief applied, repositioned to comfort.

## 2021-09-13 NOTE — ED Notes (Signed)
Small soft C-Collar applied to pt.

## 2021-09-13 NOTE — ED Notes (Signed)
Ambulated pt. Pt was able to get up and sit on side of bed with little to no assistance. Pt was able to ambulate with staff assistance, maintaining steady and equal gait. Pt denied any dizziness, shortness of breath with exertion.

## 2021-11-02 ENCOUNTER — Emergency Department (HOSPITAL_COMMUNITY)
Admission: EM | Admit: 2021-11-02 | Discharge: 2021-11-02 | Disposition: A | Discharge status: Skilled Nursing Facility | Payer: Medicare HMO | Attending: Emergency Medicine | Admitting: Emergency Medicine

## 2021-11-02 ENCOUNTER — Emergency Department (HOSPITAL_COMMUNITY): Payer: Medicare HMO

## 2021-11-02 DIAGNOSIS — Y92002 Bathroom of unspecified non-institutional (private) residence single-family (private) house as the place of occurrence of the external cause: Secondary | ICD-10-CM | POA: Diagnosis not present

## 2021-11-02 DIAGNOSIS — S0990XA Unspecified injury of head, initial encounter: Secondary | ICD-10-CM

## 2021-11-02 DIAGNOSIS — F0390 Unspecified dementia without behavioral disturbance: Secondary | ICD-10-CM | POA: Insufficient documentation

## 2021-11-02 DIAGNOSIS — W19XXXA Unspecified fall, initial encounter: Secondary | ICD-10-CM | POA: Insufficient documentation

## 2021-11-02 DIAGNOSIS — Y9301 Activity, walking, marching and hiking: Secondary | ICD-10-CM | POA: Diagnosis not present

## 2021-11-02 DIAGNOSIS — S0101XA Laceration without foreign body of scalp, initial encounter: Secondary | ICD-10-CM | POA: Diagnosis not present

## 2021-11-02 LAB — I-STAT CHEM 8, ED
BUN: 25 mg/dL — ABNORMAL HIGH (ref 8–23)
Calcium, Ion: 1.08 mmol/L — ABNORMAL LOW (ref 1.15–1.40)
Chloride: 104 mmol/L (ref 98–111)
Creatinine, Ser: 0.8 mg/dL (ref 0.44–1.00)
Glucose, Bld: 92 mg/dL (ref 70–99)
HCT: 31 % — ABNORMAL LOW (ref 36.0–46.0)
Hemoglobin: 10.5 g/dL — ABNORMAL LOW (ref 12.0–15.0)
Potassium: 3.8 mmol/L (ref 3.5–5.1)
Sodium: 138 mmol/L (ref 135–145)
TCO2: 23 mmol/L (ref 22–32)

## 2021-11-02 MED ORDER — ACETAMINOPHEN 500 MG PO TABS
1000.0000 mg | ORAL_TABLET | Freq: Once | ORAL | Status: AC
  Administered 2021-11-02: 1000 mg via ORAL
  Filled 2021-11-02: qty 2

## 2021-11-02 NOTE — ED Provider Notes (Addendum)
[facility] EMERGENCY DEPARTMENT Provider Note   CSN: [id number] Arrival date & time: 11/02/21  [id number]     History  Chief Complaint  Patient presents with   Lytle Michaels    Diana Horton is a 81 y.o. female.  82 y/o female with hx of dementia presents from [facility] s/p unwitnessed fall while walking to bathroom. Patient cannot recall what precipitated her fall. She does not believe she was ambulating with an assist device. The patient is not chronically anticoagulated. EMS placed patient in C-collar. Noted to be bleeding from posterior scalp on their arrival; bleeding now controlled.   The history is provided by the patient. No language interpreter was used.  Fall       Home Medications Prior to Admission medications   Medication Sig Start Date End Date Taking? Authorizing Provider  acetaminophen (TYLENOL) 500 MG tablet Take by mouth.    [provider]  alendronate (FOSAMAX) 70 MG tablet Take by mouth. 05/15/20   [provider]  cholecalciferol (VITAMIN D) 25 MCG (1000 UNIT) tablet Take by mouth.    [provider]  cyanocobalamin 1000 MCG tablet Take 1 tablet by mouth daily. 05/09/19   [provider]  doxycycline (VIBRAMYCIN) 100 MG capsule Take by mouth. 06/24/21   [provider]  folic acid (FOLVITE) 1 MG tablet Take by mouth. 01/16/20   [provider]  ipratropium (ATROVENT) 0.06 % nasal spray  06/20/15   [provider]  LORazepam (ATIVAN) 0.5 MG tablet Take 0.5 mg by mouth every 8 (eight) hours as needed. 05/26/21   [provider]  memantine (NAMENDA) 5 MG tablet Take by mouth. 03/31/21   [provider]  potassium chloride SA (KLOR-CON M) 20 MEQ tablet Take 1 tablet (20 mEq total) by mouth daily for 5 days. 09/13/21 09/18/21  Elgie Congo, MD  traZODone (DESYREL) 50 MG tablet Take by mouth. 06/09/20   [provider]      Allergies    Other    Review of  Systems   Review of Systems  Unable to perform ROS: Dementia    Physical Exam Updated Vital Signs BP (!) 146/90   Pulse 70   Temp 97.9 F (36.6 C) (Oral)   Resp 14   SpO2 100%   Physical Exam Vitals and nursing note reviewed.  Constitutional:      General: She is not in acute distress.    Appearance: She is well-developed. She is not diaphoretic.     Comments: Alert and mildly anxious. Thin, frail appearing.  HENT:     Head: Normocephalic.     Comments: Hematoma to right posterior parietal scalp. No active bleeding. Eyes:     General: No scleral icterus.    Extraocular Movements: Extraocular movements intact.     Conjunctiva/sclera: Conjunctivae normal.  Neck:     Comments: C-collar in place Cardiovascular:     Rate and Rhythm: Normal rate and regular rhythm.     Pulses: Normal pulses.  Pulmonary:     Effort: Pulmonary effort is normal. No respiratory distress.     Breath sounds: No stridor. No wheezing.     Comments: Lungs CTAB. Respirations even and unlabored. Musculoskeletal:        General: Normal range of motion.  Skin:    General: Skin is warm and dry.     Coloration: Skin is not pale.     Findings: No erythema or rash.  Neurological:  Mental Status: She is alert.     Coordination: Coordination normal.     Comments: GCS 15. Moving extremities spontaneously.  Psychiatric:        Behavior: Behavior normal.     ED Results / Procedures / Treatments   Labs (all labs ordered are listed, but only abnormal results are displayed) Labs Reviewed  I-STAT CHEM 8, ED - Abnormal; Notable for the following components:      Result Value   BUN 25 (*)    Calcium, Ion 1.08 (*)    Hemoglobin 10.5 (*)    HCT 31.0 (*)    All other components within normal limits  URINALYSIS, ROUTINE W REFLEX MICROSCOPIC    EKG EKG Interpretation  Date/Time:  Sunday November 02 2021 02:13:15 EDT Ventricular Rate:  64 PR Interval:  136 QRS Duration: 88 QT Interval:  426 QTC  Calculation: 440 R Axis:   -33 Text Interpretation: Sinus rhythm Left axis deviation Abnormal R-wave progression, late transition Borderline T abnormalities, anterior leads Confirmed by Ripley Fraise [phone]) on 11/02/2021 2:39:29 AM  Radiology CT HEAD WO CONTRAST (5MM)  Result Date: 11/02/2021 CLINICAL DATA:  Unwitnessed fall, posterior head injury EXAM: CT HEAD WITHOUT CONTRAST CT CERVICAL SPINE WITHOUT CONTRAST TECHNIQUE: Multidetector CT imaging of the head and cervical spine was performed following the standard protocol without intravenous contrast. Multiplanar CT image reconstructions of the cervical spine were also generated. RADIATION DOSE REDUCTION: This exam was performed according to the departmental dose-optimization program which includes automated exposure control, adjustment of the mA and/or kV according to patient size and/or use of iterative reconstruction technique. COMPARISON:  MRI cervical spine dated 09/13/2021. CT head dated 09/13/2021. FINDINGS: CT HEAD FINDINGS Brain: No evidence of acute infarction, hemorrhage, hydrocephalus, extra-axial collection or mass lesion/mass effect. Age related atrophy. Subcortical white matter and periventricular small vessel ischemic changes. Vascular: No hyperdense vessel or unexpected calcification. Skull: Normal. Negative for fracture or focal lesion. Sinuses/Orbits: The visualized paranasal sinuses are essentially clear. The mastoid air cells are unopacified. Other: Soft tissue laceration with small extracranial hematoma overlying the right parietal bone (coronal image 58). CT CERVICAL SPINE FINDINGS Alignment: Normal cervical lordosis. Skull base and vertebrae: No acute fracture. No primary bone lesion or focal pathologic process. Soft tissues and spinal canal: No prevertebral fluid or swelling. No visible canal hematoma. Disc levels: Moderate degenerative changes of the mid cervical spine. Spinal canal is patent. Upper chest: Visualized lung apices  are notable for biapical pleural-parenchymal scarring. Other: Small bilateral thyroid nodules measuring up to 12 mm on the right (series 5/image 65). Not clinically significant; no follow-up imaging recommended (ref: J Am Coll Radiol. 2015 Feb;12(2): 143-50). IMPRESSION: Soft tissue laceration with small extracranial hematoma overlying the right parietal bone. No evidence of calvarial fracture. No acute intracranial abnormality. Age related atrophy with small vessel ischemic changes. No traumatic injury to the cervical spine. Moderate degenerative changes. Electronically Signed   By: Julian Hy M.D.   On: 11/02/2021 02:51   CT Cervical Spine Wo Contrast  Result Date: 11/02/2021 CLINICAL DATA:  Unwitnessed fall, posterior head injury EXAM: CT HEAD WITHOUT CONTRAST CT CERVICAL SPINE WITHOUT CONTRAST TECHNIQUE: Multidetector CT imaging of the head and cervical spine was performed following the standard protocol without intravenous contrast. Multiplanar CT image reconstructions of the cervical spine were also generated. RADIATION DOSE REDUCTION: This exam was performed according to the departmental dose-optimization program which includes automated exposure control, adjustment of the mA and/or kV according to patient size and/or use of  iterative reconstruction technique. COMPARISON:  MRI cervical spine dated 09/13/2021. CT head dated 09/13/2021. FINDINGS: CT HEAD FINDINGS Brain: No evidence of acute infarction, hemorrhage, hydrocephalus, extra-axial collection or mass lesion/mass effect. Age related atrophy. Subcortical white matter and periventricular small vessel ischemic changes. Vascular: No hyperdense vessel or unexpected calcification. Skull: Normal. Negative for fracture or focal lesion. Sinuses/Orbits: The visualized paranasal sinuses are essentially clear. The mastoid air cells are unopacified. Other: Soft tissue laceration with small extracranial hematoma overlying the right parietal bone (coronal  image 58). CT CERVICAL SPINE FINDINGS Alignment: Normal cervical lordosis. Skull base and vertebrae: No acute fracture. No primary bone lesion or focal pathologic process. Soft tissues and spinal canal: No prevertebral fluid or swelling. No visible canal hematoma. Disc levels: Moderate degenerative changes of the mid cervical spine. Spinal canal is patent. Upper chest: Visualized lung apices are notable for biapical pleural-parenchymal scarring. Other: Small bilateral thyroid nodules measuring up to 12 mm on the right (series 5/image 65). Not clinically significant; no follow-up imaging recommended (ref: J Am Coll Radiol. 2015 Feb;12(2): 143-50). IMPRESSION: Soft tissue laceration with small extracranial hematoma overlying the right parietal bone. No evidence of calvarial fracture. No acute intracranial abnormality. Age related atrophy with small vessel ischemic changes. No traumatic injury to the cervical spine. Moderate degenerative changes. Electronically Signed   By: Julian Hy M.D.   On: 11/02/2021 02:51    Procedures Procedures     LACERATION REPAIR Performed by: Antonietta Breach Authorized by: Antonietta Breach Consent: Verbal consent obtained. Risks and benefits: risks, benefits and alternatives were discussed Consent given by: patient Patient identity confirmed: provided demographic data Prepped and Draped in normal sterile fashion Wound explored  Laceration Location: scalp  Laceration Length: 4cm  No Foreign Bodies seen or palpated  Irrigation method: syringe Amount of cleaning: standard  Skin closure: staples  Number of sutures: 4  Technique: simple  Patient tolerance: Patient tolerated the procedure well with no immediate complications.   Medications Ordered in ED Medications  acetaminophen (TYLENOL) tablet 1,000 mg (1,000 mg Oral Given 11/02/21 0505)    ED Course/ Medical Decision Making/ A&P Clinical Course as of 11/02/21 0603  Sun Nov 02, 2021  0453 Wound cleaned  to reveal 4cm posterior scalp laceration and hematoma. Laceration repaired with staples w/o complication. [KH]    Clinical Course User Index [KH] Antonietta Breach, PA-C                           Medical Decision Making Amount and/or Complexity of Data Reviewed Labs: ordered. Radiology: ordered.  Risk OTC drugs.   This patient presents to the ED for concern of head injury s/p fall, this involves an extensive number of treatment options, and is a complaint that carries with it a high risk of complications and morbidity.  The differential diagnosis includes scalp hematoma vs skull fx vs ICH vs concussion   Co morbidities that complicate the patient evaluation  Dementia    Additional history obtained:  Additional history obtained from EMS, SNF facility External records from outside source obtained and reviewed including prior head CT from 08/2021   Lab Tests:  I Ordered, and personally interpreted labs.  The pertinent results include:  stable H/H, no electrolyte derangements   Imaging Studies ordered:  I ordered imaging studies including CT head and C-spine  I independently visualized and interpreted imaging which showed no acute traumatic pathology I agree with the radiologist interpretation   Cardiac Monitoring:  The patient was maintained on a cardiac monitor.  I personally viewed and interpreted the cardiac monitored which showed an underlying rhythm of: NSR   Medicines ordered and prescription drug management:  I ordered medication including Tylenol for head pain post staple placement  Reevaluation of the patient after these medicines showed that the patient improved I have reviewed the patients home medicines and have made adjustments as needed   Test Considered:  Portable Xray chest, pelvis   Reevaluation:  After the interventions noted above, I reevaluated the patient and found that they have : remained stable   Social Determinants of Health:  Frequent  falls Insured patient   Dispostion:  After consideration of the diagnostic results and the patients response to treatment, I feel that the patent would benefit from use of rolling walker or cane when mobile. Can continue tylenol PRN for pain. Advised return in 1 week for staple removal. Will transport back to SNF via [facility].   Vitals:   11/02/21 0345 11/02/21 0400 11/02/21 0415 11/02/21 0500  BP: (!) 136/92 (!) 151/88 (!) 148/89 (!) 146/90  Pulse: 65 64 69 70  Resp: (!) 24 14 16 14   Temp:      TempSrc:      SpO2: 100% 100% 100% 100%          Final Clinical Impression(s) / ED Diagnoses Final diagnoses:  Fall, initial encounter  Injury of head, initial encounter    Rx / DC Orders ED Discharge Orders     None         Antonietta Breach, PA-C 11/02/21 0611    Antonietta Breach, PA-C 11/02/21 [id number]    Ripley Fraise, MD 11/02/21 [phone]

## 2021-11-02 NOTE — ED Triage Notes (Signed)
Patient arrives with [facility] from [facility] c/o unwitnessed fall while walking to the bathroom. Pt is in C-collar and has "knot on the back of her head" in which was bleeding but is now controlled. Pt has hx of dementia and is a&o to self. Per EMS, patient is not on thinners and had no LOC.  EMS vitals:  146 BP palpated HR 64 16 RR CBG 111

## 2021-11-02 NOTE — ED Notes (Signed)
Patient awake and alert this morning, pleasantly confused, able to verbalize needs, ambulated to the restroom with assistance, will continue to monitor.

## 2021-11-02 NOTE — Discharge Instructions (Addendum)
We recommend that you use a rolling walker or cane when walking to assist your balance and prevent future falls.  Have your staples removed in 1 week.  Take Tylenol as needed for any persistent pain or headache.  Return to the ED for new or concerning symptoms.

## 2021-11-02 NOTE — ED Notes (Signed)
Updated patient's son Collier Salina in [place] at [phone] on patient. Patient's son requested for staff to call him when mother is up for discharge so that he can try and call local family to transport mother back to facility.

## 2021-12-11 ENCOUNTER — Emergency Department (HOSPITAL_COMMUNITY): Payer: Medicare HMO

## 2021-12-11 ENCOUNTER — Emergency Department (HOSPITAL_COMMUNITY)
Admission: EM | Admit: 2021-12-11 | Discharge: 2021-12-11 | Disposition: A | Discharge status: Skilled Nursing Facility | Payer: Medicare HMO | Attending: Emergency Medicine | Admitting: Emergency Medicine

## 2021-12-11 DIAGNOSIS — W19XXXA Unspecified fall, initial encounter: Secondary | ICD-10-CM | POA: Insufficient documentation

## 2021-12-11 DIAGNOSIS — E860 Dehydration: Secondary | ICD-10-CM | POA: Diagnosis not present

## 2021-12-11 DIAGNOSIS — S0990XA Unspecified injury of head, initial encounter: Secondary | ICD-10-CM | POA: Diagnosis present

## 2021-12-11 DIAGNOSIS — S0003XA Contusion of scalp, initial encounter: Secondary | ICD-10-CM | POA: Insufficient documentation

## 2021-12-11 DIAGNOSIS — F0390 Unspecified dementia without behavioral disturbance: Secondary | ICD-10-CM | POA: Insufficient documentation

## 2021-12-11 DIAGNOSIS — R944 Abnormal results of kidney function studies: Secondary | ICD-10-CM | POA: Insufficient documentation

## 2021-12-11 DIAGNOSIS — Y92129 Unspecified place in nursing home as the place of occurrence of the external cause: Secondary | ICD-10-CM | POA: Insufficient documentation

## 2021-12-11 LAB — BASIC METABOLIC PANEL
Anion gap: 11 (ref 5–15)
BUN: 32 mg/dL — ABNORMAL HIGH (ref 8–23)
CO2: 22 mmol/L (ref 22–32)
Calcium: 9.3 mg/dL (ref 8.9–10.3)
Chloride: 108 mmol/L (ref 98–111)
Creatinine, Ser: 1.23 mg/dL — ABNORMAL HIGH (ref 0.44–1.00)
GFR, Estimated: 44 mL/min — ABNORMAL LOW (ref >60)
Glucose, Bld: 95 mg/dL (ref 70–99)
Potassium: 3.9 mmol/L (ref 3.5–5.1)
Sodium: 141 mmol/L (ref 135–145)

## 2021-12-11 LAB — CBC
HCT: 33.1 % — ABNORMAL LOW (ref 36.0–46.0)
Hemoglobin: 10.3 g/dL — ABNORMAL LOW (ref 12.0–15.0)
MCH: 28.1 pg (ref 26.0–34.0)
MCHC: 31.1 g/dL (ref 30.0–36.0)
MCV: 90.4 fL (ref 80.0–100.0)
Platelets: 243 10*3/uL (ref 150–400)
RBC: 3.66 MIL/uL — ABNORMAL LOW (ref 3.87–5.11)
RDW: 16.2 % — ABNORMAL HIGH (ref 11.5–15.5)
WBC: 7.1 10*3/uL (ref 4.0–10.5)
nRBC: 0 % (ref 0.0–0.2)

## 2021-12-11 MED ORDER — SODIUM CHLORIDE 0.9 % IV BOLUS
500.0000 mL | Freq: Once | INTRAVENOUS | Status: AC
  Administered 2021-12-11: 500 mL via INTRAVENOUS

## 2021-12-11 NOTE — Discharge Instructions (Signed)
The x-rays did not show any signs of acute fractures or internal bleeding.  You do have a large bruise on your forehead.  That should slowly resolve over time.  Blood test just also show you are mildly dehydrated.  We have given you IV fluids.  Follow-up with your doctor to be rechecked.  Return for worsening symptoms

## 2021-12-11 NOTE — ED Notes (Signed)
[facility] called they will be here within a hour 

## 2021-12-11 NOTE — ED Triage Notes (Signed)
Pt arriving from [facility] with [facility], pt is alert and speaking in full sentences. Pt had a fall last night per facility unknown when she fell and how long she was on the ground for Unknown LOC, unknown head strike. NO blood thinners Pt does present with a hematoma to the right forehead.  Hx Dementia

## 2021-12-11 NOTE — ED Notes (Signed)
Patient transported to X-ray 

## 2021-12-11 NOTE — ED Provider Notes (Signed)
[facility] EMERGENCY DEPARTMENT Provider Note   CSN: [id number] Arrival date & time: 12/11/21  2202     History  Chief Complaint  Patient presents with   Marletta Lor    Diana Horton is a 81 y.o. female.   Fall     Patient presents to the ED for evaluation after fall.  Patient is a resident of [facility].  He does have a history of dementia.  Patient had a fall last night at the facility.  She was found on the ground this morning.  They are not sure how long she had been on the ground.  Patient was noted to have bruising of her head.  Patient states she thinks she fell trying to get into bed.  She does not exactly remember what happened.  She does complain of a mild headache.  Otherwise did not have any pain in her arms or legs.  No back pain.  Home Medications Prior to Admission medications   Medication Sig Start Date End Date Taking? Authorizing Provider  acetaminophen (TYLENOL) 500 MG tablet Take by mouth.    [provider]  alendronate (FOSAMAX) 70 MG tablet Take by mouth. 05/15/20   [provider]  cholecalciferol (VITAMIN D) 25 MCG (1000 UNIT) tablet Take by mouth.    [provider]  cyanocobalamin 1000 MCG tablet Take 1 tablet by mouth daily. 05/09/19   [provider]  doxycycline (VIBRAMYCIN) 100 MG capsule Take by mouth. 06/24/21   [provider]  folic acid (FOLVITE) 1 MG tablet Take by mouth. 01/16/20   [provider]  ipratropium (ATROVENT) 0.06 % nasal spray  06/20/15   [provider]  LORazepam (ATIVAN) 0.5 MG tablet Take 0.5 mg by mouth every 8 (eight) hours as needed. 05/26/21   [provider]  memantine (NAMENDA) 5 MG tablet Take by mouth. 03/31/21   [provider]  potassium chloride SA (KLOR-CON M) 20 MEQ tablet Take 1 tablet (20 mEq total) by mouth daily for 5 days. 09/13/21 09/18/21  Mardene Sayer, MD  traZODone (DESYREL) 50 MG tablet Take by mouth.  06/09/20   [provider]      Allergies    Other    Review of Systems   Review of Systems  Physical Exam Updated Vital Signs BP (!) 150/97 (BP Location: Left Arm)   Pulse 71   Temp 98 F (36.7 C) (Oral)   Resp 16   SpO2 100%  Physical Exam Vitals and nursing note reviewed.  Constitutional:      Appearance: She is well-developed.     Comments: Elderly, frail  HENT:     Head: Normocephalic.     Comments: Hematoma right forehead, mild bruising left periorbital region    Right Ear: External ear normal.     Left Ear: External ear normal.  Eyes:     General: No scleral icterus.       Right eye: No discharge.        Left eye: No discharge.     Conjunctiva/sclera: Conjunctivae normal.  Neck:     Trachea: No tracheal deviation.  Cardiovascular:     Rate and Rhythm: Normal rate.  Pulmonary:     Effort: Pulmonary effort is normal. No respiratory distress.     Breath sounds: No stridor.  Abdominal:     General: There is no distension.  Musculoskeletal:        General: No swelling or deformity.  Cervical back: Normal and neck supple.     Thoracic back: Normal.     Lumbar back: Normal.     Comments: Tenderness palpation upper extremities and lower extremities  Skin:    General: Skin is warm and dry.     Findings: No rash.  Neurological:     Mental Status: She is alert.     Cranial Nerves: No cranial nerve deficit (no gross deficits), dysarthria or facial asymmetry.     Motor: No weakness, tremor or atrophy.     ED Results / Procedures / Treatments   Labs (all labs ordered are listed, but only abnormal results are displayed) Labs Reviewed  CBC - Abnormal; Notable for the following components:      Result Value   RBC 3.66 (*)    Hemoglobin 10.3 (*)    HCT 33.1 (*)    RDW 16.2 (*)    All other components within normal limits  BASIC METABOLIC PANEL - Abnormal; Notable for the following components:   BUN 32 (*)    Creatinine, Ser 1.23 (*)    GFR,  Estimated 44 (*)    All other components within normal limits    EKG EKG Interpretation  Date/Time:  Thursday December 11 2021 09:13:08 EST Ventricular Rate:  64 PR Interval:  144 QRS Duration: 86 QT Interval:  418 QTC Calculation: 431 R Axis:   194 Text Interpretation: Normal sinus rhythm Right superior axis deviation Nonspecific ST abnormality Abnormal ECG When compared with ECG of 02-Nov-2021 02:13, No significant change since last tracing Confirmed by Linwood Dibbles [phone]) on 12/11/2021 9:56:33 AM  Radiology CT HEAD WO CONTRAST ( )  Result Date: 12/11/2021 CLINICAL DATA:  Unwitnessed fall. EXAM: CT HEAD WITHOUT CONTRAST CT CERVICAL SPINE WITHOUT CONTRAST TECHNIQUE: Multidetector CT imaging of the head and cervical spine was performed following the standard protocol without intravenous contrast. Multiplanar CT image reconstructions of the cervical spine were also generated. RADIATION DOSE REDUCTION: This exam was performed according to the departmental dose-optimization program which includes automated exposure control, adjustment of the mA and/or kV according to patient size and/or use of iterative reconstruction technique. COMPARISON:  November 02, 2021. FINDINGS: CT HEAD FINDINGS Brain: Mild chronic ischemic white matter disease is noted. No mass effect or midline shift is noted. Ventricular size is within normal limits. There is no evidence of mass lesion, hemorrhage or acute infarction. Vascular: No hyperdense vessel or unexpected calcification. Skull: Normal. Negative for fracture or focal lesion. Sinuses/Orbits: No acute finding. Other: Large right frontal scalp hematoma is noted. CT CERVICAL SPINE FINDINGS Alignment: Normal. Skull base and vertebrae: No acute fracture. No primary bone lesion or focal pathologic process. Soft tissues and spinal canal: No prevertebral fluid or swelling. No visible canal hematoma. Disc levels: Severe degenerative disc disease is noted at C4-5, C5-6 and C6-7.  Upper chest: Negative. Other: Degenerative changes are seen involving the left-sided posterior facet joints. IMPRESSION: Large right frontal scalp hematoma. No acute intracranial abnormality seen. 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 No acute abnormality is noted. Electronically Signed   By: Lupita Raider M.D.   On: 12/11/2021 10:36   CT Cervical Spine Wo Contrast  Result Date: 12/11/2021 CLINICAL DATA:  Unwitnessed fall. EXAM: CT HEAD WITHOUT CONTRAST CT CERVICAL SPINE WITHOUT CONTRAST TECHNIQUE: Multidetector CT imaging of the head and cervical spine was performed following the standard protocol without intravenous contrast. Multiplanar CT image reconstructions of the cervical spine were also generated. RADIATION DOSE REDUCTION:  This exam was performed according to the departmental dose-optimization program which includes automated exposure control, adjustment of the mA and/or kV according to patient size and/or use of iterative reconstruction technique. COMPARISON:  November 02, 2021. FINDINGS: CT HEAD FINDINGS Brain: Mild chronic ischemic white matter disease is noted. No mass effect or midline shift is noted. Ventricular size is within normal limits. There is no evidence of mass lesion, hemorrhage or acute infarction. Vascular: No hyperdense vessel or unexpected calcification. Skull: Normal. Negative for fracture or focal lesion. Sinuses/Orbits: No acute finding. Other: Large right frontal scalp hematoma is noted. CT CERVICAL SPINE FINDINGS Alignment: Normal. Skull base and vertebrae: No acute fracture. No primary bone lesion or focal pathologic process. Soft tissues and spinal canal: No prevertebral fluid or swelling. No visible canal hematoma. Disc levels: Severe degenerative disc disease is noted at C4-5, C5-6 and C6-7. Upper chest: Negative. Other: Degenerative changes are seen involving the left-sided posterior facet joints. IMPRESSION: Large right frontal scalp  hematoma. No acute intracranial abnormality seen. 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 No acute abnormality is noted. Electronically Signed   By: Lupita Raider M.D.   On: 12/11/2021 10:36   DG Chest 2 View  Result Date: 12/11/2021 CLINICAL DATA:  Fall EXAM: CHEST - 2 VIEW COMPARISON:  Chest x-ray dated August 19th 2023; November 09, 2018 FINDINGS: Normal heart size. Tortuous thoracic aorta, unchanged when compared to prior exams and likely age related. Mild left basilar opacity. No pleural effusion pneumothorax. New mild-to-moderate compression deformities of the upper thoracic spine. IMPRESSION: 1. Mild left basilar opacity, differential considerations include atelectasis, infection or aspiration. 2. Mild-to-moderate compression deformities of the upper thoracic spine, which are new when compared with 2020 prior and age indeterminate. Recommend correlation with point tenderness. Electronically Signed   By: Allegra Lai M.D.   On: 12/11/2021 10:17    Procedures Procedures    Medications Ordered in ED Medications  sodium chloride 0.9 % bolus 500 mL (500 mLs Intravenous New Bag/Given 12/11/21 1142)    ED Course/ Medical Decision Making/ A&P Clinical Course as of 12/11/21 1223  Thu Dec 11, 2021  1048 Basic metabolic panel(!) Creatinine slightly elevated compared to previous [JK]  1048 CBC(!) Anemia noted but unchanged compared to previous values [JK]  1048 CT HEAD WO CONTRAST ( ) Head CT and C-spine CT without serious injuries.  Frontal scalp hematoma noted [JK]  1049 Chest x-ray shows compression deformities in the thoracic spine.  Patient does not have any focal tenderness.  I doubt acute fracture [JK]  1049 Mild left basilar opacity noted.  Patient is denying any respiratory symptoms.  Doubt acute infection. [JK]    Clinical Course User Index [JK] Linwood Dibbles, MD                           Medical Decision Making Amount and/or Complexity of  Data Reviewed Labs: ordered. Decision-making details documented in ED Course. Radiology: ordered. Decision-making details documented in ED Course.   Patient presented to the ED for evaluation after fall at her nursing facility.  ED work-up is reassuring.  No signs of severe anemia.  Mild dehydration noted but no signs of severe kidney dysfunction.  Patient was treated with IV fluids.    No signs of serious injury on her CT scans.  Patient has remained awake and alert.  Appears stable for discharge back to her nursing facility will make sure patient is  able to walk safely prior to discharge         Final Clinical Impression(s) / ED Diagnoses Final diagnoses:  Scalp hematoma, initial encounter  Dehydration    Rx / DC Orders ED Discharge Orders     None         Linwood DibblesKnapp, Gabrielly Mccrystal, MD 12/11/21 1223

## 2022-01-11 ENCOUNTER — Other Ambulatory Visit: Payer: Self-pay

## 2022-01-11 ENCOUNTER — Emergency Department (HOSPITAL_COMMUNITY): Payer: Medicare HMO

## 2022-01-11 ENCOUNTER — Encounter (HOSPITAL_COMMUNITY): Payer: Self-pay | Admitting: Emergency Medicine

## 2022-01-11 ENCOUNTER — Inpatient Hospital Stay (HOSPITAL_COMMUNITY)
Admission: EM | Admit: 2022-01-11 | Discharge: 2022-01-27 | DRG: 199 | Disposition: A | Discharge status: Hospice | Payer: Medicare HMO | Source: Skilled Nursing Facility | Attending: Internal Medicine | Admitting: Internal Medicine

## 2022-01-11 DIAGNOSIS — Z9181 History of falling: Secondary | ICD-10-CM

## 2022-01-11 DIAGNOSIS — B9620 Unspecified Escherichia coli [E. coli] as the cause of diseases classified elsewhere: Secondary | ICD-10-CM | POA: Diagnosis not present

## 2022-01-11 DIAGNOSIS — E162 Hypoglycemia, unspecified: Secondary | ICD-10-CM | POA: Diagnosis present

## 2022-01-11 DIAGNOSIS — R296 Repeated falls: Secondary | ICD-10-CM | POA: Diagnosis present

## 2022-01-11 DIAGNOSIS — Y92239 Unspecified place in hospital as the place of occurrence of the external cause: Secondary | ICD-10-CM | POA: Diagnosis not present

## 2022-01-11 DIAGNOSIS — G9341 Metabolic encephalopathy: Secondary | ICD-10-CM | POA: Diagnosis not present

## 2022-01-11 DIAGNOSIS — J9811 Atelectasis: Secondary | ICD-10-CM | POA: Diagnosis present

## 2022-01-11 DIAGNOSIS — F03C11 Unspecified dementia, severe, with agitation: Secondary | ICD-10-CM | POA: Diagnosis present

## 2022-01-11 DIAGNOSIS — Z1152 Encounter for screening for COVID-19: Secondary | ICD-10-CM

## 2022-01-11 DIAGNOSIS — S22069A Unspecified fracture of T7-T8 vertebra, initial encounter for closed fracture: Secondary | ICD-10-CM | POA: Diagnosis present

## 2022-01-11 DIAGNOSIS — H9190 Unspecified hearing loss, unspecified ear: Secondary | ICD-10-CM | POA: Diagnosis present

## 2022-01-11 DIAGNOSIS — Z66 Do not resuscitate: Secondary | ICD-10-CM

## 2022-01-11 DIAGNOSIS — R451 Restlessness and agitation: Secondary | ICD-10-CM

## 2022-01-11 DIAGNOSIS — F05 Delirium due to known physiological condition: Secondary | ICD-10-CM | POA: Diagnosis present

## 2022-01-11 DIAGNOSIS — M810 Age-related osteoporosis without current pathological fracture: Secondary | ICD-10-CM | POA: Diagnosis present

## 2022-01-11 DIAGNOSIS — Z781 Physical restraint status: Secondary | ICD-10-CM

## 2022-01-11 DIAGNOSIS — Z681 Body mass index (BMI) 19 or less, adult: Secondary | ICD-10-CM

## 2022-01-11 DIAGNOSIS — W19XXXA Unspecified fall, initial encounter: Secondary | ICD-10-CM | POA: Diagnosis not present

## 2022-01-11 DIAGNOSIS — N390 Urinary tract infection, site not specified: Secondary | ICD-10-CM | POA: Diagnosis not present

## 2022-01-11 DIAGNOSIS — Z515 Encounter for palliative care: Secondary | ICD-10-CM

## 2022-01-11 DIAGNOSIS — D62 Acute posthemorrhagic anemia: Secondary | ICD-10-CM | POA: Diagnosis present

## 2022-01-11 DIAGNOSIS — J942 Hemothorax: Secondary | ICD-10-CM | POA: Diagnosis present

## 2022-01-11 DIAGNOSIS — S271XXA Traumatic hemothorax, initial encounter: Principal | ICD-10-CM | POA: Diagnosis present

## 2022-01-11 DIAGNOSIS — E538 Deficiency of other specified B group vitamins: Secondary | ICD-10-CM | POA: Diagnosis present

## 2022-01-11 DIAGNOSIS — Z79899 Other long term (current) drug therapy: Secondary | ICD-10-CM

## 2022-01-11 DIAGNOSIS — S2241XA Multiple fractures of ribs, right side, initial encounter for closed fracture: Secondary | ICD-10-CM

## 2022-01-11 DIAGNOSIS — R52 Pain, unspecified: Secondary | ICD-10-CM

## 2022-01-11 DIAGNOSIS — J90 Pleural effusion, not elsewhere classified: Secondary | ICD-10-CM | POA: Diagnosis present

## 2022-01-11 DIAGNOSIS — Z7951 Long term (current) use of inhaled steroids: Secondary | ICD-10-CM

## 2022-01-11 DIAGNOSIS — I7121 Aneurysm of the ascending aorta, without rupture: Secondary | ICD-10-CM | POA: Diagnosis present

## 2022-01-11 DIAGNOSIS — M545 Low back pain, unspecified: Secondary | ICD-10-CM | POA: Diagnosis present

## 2022-01-11 DIAGNOSIS — R64 Cachexia: Secondary | ICD-10-CM | POA: Diagnosis present

## 2022-01-11 DIAGNOSIS — W010XXA Fall on same level from slipping, tripping and stumbling without subsequent striking against object, initial encounter: Secondary | ICD-10-CM | POA: Diagnosis present

## 2022-01-11 DIAGNOSIS — L8990 Pressure ulcer of unspecified site, unspecified stage: Secondary | ICD-10-CM | POA: Insufficient documentation

## 2022-01-11 DIAGNOSIS — Z23 Encounter for immunization: Secondary | ICD-10-CM

## 2022-01-11 DIAGNOSIS — Z7983 Long term (current) use of bisphosphonates: Secondary | ICD-10-CM

## 2022-01-11 DIAGNOSIS — R627 Adult failure to thrive: Secondary | ICD-10-CM | POA: Diagnosis present

## 2022-01-11 DIAGNOSIS — E43 Unspecified severe protein-calorie malnutrition: Secondary | ICD-10-CM | POA: Insufficient documentation

## 2022-01-11 HISTORY — DX: Unspecified dementia, unspecified severity, without behavioral disturbance, psychotic disturbance, mood disturbance, and anxiety: F03.90

## 2022-01-11 LAB — CBC WITH DIFFERENTIAL/PLATELET
Abs Immature Granulocytes: 0.08 10*3/uL — ABNORMAL HIGH (ref 0.00–0.07)
Basophils Absolute: 0 10*3/uL (ref 0.0–0.1)
Basophils Relative: 0 %
Eosinophils Absolute: 0.1 10*3/uL (ref 0.0–0.5)
Eosinophils Relative: 1 %
HCT: 28.8 % — ABNORMAL LOW (ref 36.0–46.0)
Hemoglobin: 9 g/dL — ABNORMAL LOW (ref 12.0–15.0)
Immature Granulocytes: 1 %
Lymphocytes Relative: 9 %
Lymphs Abs: 1 10*3/uL (ref 0.7–4.0)
MCH: 28.1 pg (ref 26.0–34.0)
MCHC: 31.3 g/dL (ref 30.0–36.0)
MCV: 90 fL (ref 80.0–100.0)
Monocytes Absolute: 0.9 10*3/uL (ref 0.1–1.0)
Monocytes Relative: 8 %
Neutro Abs: 8.9 10*3/uL — ABNORMAL HIGH (ref 1.7–7.7)
Neutrophils Relative %: 81 %
Platelets: 361 10*3/uL (ref 150–400)
RBC: 3.2 MIL/uL — ABNORMAL LOW (ref 3.87–5.11)
RDW: 16.4 % — ABNORMAL HIGH (ref 11.5–15.5)
WBC: 11 10*3/uL — ABNORMAL HIGH (ref 4.0–10.5)
nRBC: 0 % (ref 0.0–0.2)

## 2022-01-11 LAB — TYPE AND SCREEN
ABO/RH(D): A POS
Antibody Screen: NEGATIVE

## 2022-01-11 LAB — RESP PANEL BY RT-PCR (RSV, FLU A&B, COVID)  RVPGX2
Influenza A by PCR: NEGATIVE
Influenza B by PCR: NEGATIVE
Resp Syncytial Virus by PCR: NEGATIVE
SARS Coronavirus 2 by RT PCR: NEGATIVE

## 2022-01-11 LAB — BASIC METABOLIC PANEL
Anion gap: 11 (ref 5–15)
BUN: 38 mg/dL — ABNORMAL HIGH (ref 8–23)
CO2: 24 mmol/L (ref 22–32)
Calcium: 9.2 mg/dL (ref 8.9–10.3)
Chloride: 104 mmol/L (ref 98–111)
Creatinine, Ser: 1.01 mg/dL — ABNORMAL HIGH (ref 0.44–1.00)
GFR, Estimated: 56 mL/min — ABNORMAL LOW (ref >60)
Glucose, Bld: 96 mg/dL (ref 70–99)
Potassium: 3.8 mmol/L (ref 3.5–5.1)
Sodium: 139 mmol/L (ref 135–145)

## 2022-01-11 LAB — ABO/RH: ABO/RH(D): A POS

## 2022-01-11 LAB — TROPONIN I (HIGH SENSITIVITY): Troponin I (High Sensitivity): 13 ng/L (ref <18)

## 2022-01-11 LAB — LACTIC ACID, PLASMA: Lactic Acid, Venous: 0.9 mmol/L (ref 0.5–1.9)

## 2022-01-11 MED ORDER — LORAZEPAM 2 MG/ML IJ SOLN
0.5000 mg | Freq: Three times a day (TID) | INTRAMUSCULAR | Status: DC | PRN
  Administered 2022-01-11: 0.5 mg via INTRAVENOUS
  Filled 2022-01-11: qty 1

## 2022-01-11 MED ORDER — TETANUS-DIPHTH-ACELL PERTUSSIS 5-2.5-18.5 LF-MCG/0.5 IM SUSY
0.5000 mL | PREFILLED_SYRINGE | Freq: Once | INTRAMUSCULAR | Status: AC
  Administered 2022-01-11: 0.5 mL via INTRAMUSCULAR
  Filled 2022-01-11: qty 0.5

## 2022-01-11 MED ORDER — LORAZEPAM 0.5 MG PO TABS
0.5000 mg | ORAL_TABLET | Freq: Three times a day (TID) | ORAL | Status: DC | PRN
  Administered 2022-01-12 – 2022-01-17 (×9): 0.5 mg via ORAL
  Filled 2022-01-11 (×9): qty 1

## 2022-01-11 MED ORDER — SODIUM CHLORIDE 0.9 % IV SOLN: INTRAVENOUS | Status: DC

## 2022-01-11 MED ORDER — SENNOSIDES-DOCUSATE SODIUM 8.6-50 MG PO TABS
1.0000 | ORAL_TABLET | Freq: Every evening | ORAL | Status: DC | PRN
  Administered 2022-01-26: 1 via ORAL
  Filled 2022-01-11: qty 1

## 2022-01-11 MED ORDER — IOHEXOL 300 MG/ML  SOLN
100.0000 mL | Freq: Once | INTRAMUSCULAR | Status: AC | PRN
  Administered 2022-01-11: 100 mL via INTRAVENOUS

## 2022-01-11 MED ORDER — ACETAMINOPHEN 325 MG PO TABS
650.0000 mg | ORAL_TABLET | Freq: Four times a day (QID) | ORAL | Status: DC | PRN
  Administered 2022-01-14 – 2022-01-18 (×2): 650 mg via ORAL
  Filled 2022-01-11 (×2): qty 2

## 2022-01-11 MED ORDER — MEMANTINE HCL 10 MG PO TABS
10.0000 mg | ORAL_TABLET | Freq: Two times a day (BID) | ORAL | Status: DC
  Administered 2022-01-12 – 2022-01-25 (×26): 10 mg via ORAL
  Filled 2022-01-11 (×26): qty 1

## 2022-01-11 MED ORDER — ACETAMINOPHEN 650 MG RE SUPP: 650.0000 mg | Freq: Four times a day (QID) | RECTAL | Status: DC | PRN

## 2022-01-11 MED ORDER — ONDANSETRON HCL 4 MG/2ML IJ SOLN
4.0000 mg | Freq: Four times a day (QID) | INTRAMUSCULAR | Status: DC | PRN
  Administered 2022-01-14: 4 mg via INTRAVENOUS
  Filled 2022-01-11: qty 2

## 2022-01-11 MED ORDER — HYDROCODONE-ACETAMINOPHEN 5-325 MG PO TABS
1.0000 | ORAL_TABLET | Freq: Four times a day (QID) | ORAL | Status: DC | PRN
  Administered 2022-01-12 – 2022-01-23 (×18): 1 via ORAL
  Filled 2022-01-11 (×18): qty 1

## 2022-01-11 MED ORDER — ONDANSETRON HCL 4 MG PO TABS: 4.0000 mg | ORAL_TABLET | Freq: Four times a day (QID) | ORAL | Status: DC | PRN

## 2022-01-11 MED ORDER — TRAZODONE HCL 50 MG PO TABS
50.0000 mg | ORAL_TABLET | Freq: Every day | ORAL | Status: DC
  Administered 2022-01-12 – 2022-01-24 (×13): 50 mg via ORAL
  Filled 2022-01-11: qty 1
  Filled 2022-01-11 (×2): qty 0.5
  Filled 2022-01-11: qty 1
  Filled 2022-01-11 (×2): qty 0.5
  Filled 2022-01-11 (×2): qty 1
  Filled 2022-01-11: qty 0.5
  Filled 2022-01-11 (×6): qty 1

## 2022-01-11 NOTE — ED Triage Notes (Signed)
Pt BIB EMS from [facility]. EMS reports pt had unwitnessed fall. Pt has left sided scalp hematoma. Pt is at baseline per facility. PT also C/O left hip pain. PT ambulatory on scene per EMS.

## 2022-01-11 NOTE — H&P (Addendum)
History and Physical  Patient: Diana Horton YYT:[id number] DOB: 1940/12/19 DOA: 01/11/2022 DOS: the patient was seen and examined on 01/11/2022 Patient coming from: Home  Chief Complaint:  Chief Complaint  Patient presents with   Fall   HPI: Diana Horton is a 81 y.o. female with PMH significant of dementia osteoporosis, B12 deficiency presenting to the hospital with a fall. Patient is a resident at [facility] memory care unit. Had a fall which was unwitnessed. After which she sustained a left-sided hematoma and was brought to the hospital for further workup. Reportedly fall while the patient was ambulating as per her normal, and she turned around a corner and her aide heard a fall. There is no report of loss of consciousness. There is reported history of frequent fall. Patient is unable to provide any history due to her dementia but denies having any complaints of chest pain or abdominal pain.  She also denies having any headache complaint.  Her main concern is she wants to urinate and does not want to make the bed wet while she has a pure wick. No nausea no vomiting.  No diarrhea.  No recent change in medications reported.  Review of Systems: As mentioned in the history of present illness. All other systems reviewed and are negative. Past Medical History:  Diagnosis Date   Dementia (HCC)    History reviewed. No pertinent surgical history. Social History:  reports that she has an unknown smoking status. She has never used smokeless tobacco. No history on file for alcohol use and drug use. Allergies  Allergen Reactions   Other     age   History reviewed. No pertinent family history. Prior to Admission medications   Medication Sig Start Date End Date Taking? Authorizing Provider  acetaminophen (TYLENOL) 500 MG tablet Take by mouth.    [provider]  alendronate (FOSAMAX) 70 MG tablet Take by mouth. 05/15/20   [provider]  cholecalciferol (VITAMIN  D) 25 MCG (1000 UNIT) tablet Take by mouth.    [provider]  cyanocobalamin 1000 MCG tablet Take 1 tablet by mouth daily. 05/09/19   [provider]  doxycycline (VIBRAMYCIN) 100 MG capsule Take by mouth. 06/24/21   [provider]  folic acid (FOLVITE) 1 MG tablet Take by mouth. 01/16/20   [provider]  ipratropium (ATROVENT) 0.06 % nasal spray  06/20/15   [provider]  LORazepam (ATIVAN) 0.5 MG tablet Take 0.5 mg by mouth every 8 (eight) hours as needed. 05/26/21   [provider]  memantine (NAMENDA) 5 MG tablet Take by mouth. 03/31/21   [provider]  potassium chloride SA (KLOR-CON M) 20 MEQ tablet Take 1 tablet (20 mEq total) by mouth daily for 5 days. 09/13/21 09/18/21  Mardene Sayer, MD  traZODone (DESYREL) 50 MG tablet Take by mouth. 06/09/20   [provider]   Physical Exam: Vitals:   01/11/22 1645 01/11/22 1730 01/11/22 1815 01/11/22 1819  BP: (!) 139/91  (!) 157/95   Pulse: 86 86 94 97  Resp: 17 20 19 18   Temp:      SpO2: 92% 95% 94% 92%   General: Appear in moderate distress; no visible Abnormal Neck Mass Or lumps, Conjunctiva normal Cardiovascular: S1 and S2 Present, no Murmur, Respiratory: good respiratory effort, Bilateral Air entry present and bilateral  Crackles, no wheezes Abdomen: Bowel Sound present, Non tender  Extremities: no Pedal edema Neurology: alert and not oriented to time, place, and person  Gait not checked due to patient safety concerns   Data Reviewed: Since last encounter, pertinent lab results CBC and BMP   . I have ordered test including CBC and BMP. I have discussed pt's care plan and test results with EDP. I have ordered imaging ultrasound-guided thoracentesis. I have independently visualized and interpreted imaging CT chest which showed large right pleural effusion. I have independently visualized and interpreted EKG which showed EKG: normal sinus rhythm, nonspecific  ST and T waves changes.  Results of the CT chest abdomen pelvis shows multiple right-sided rib fracture, T7 fracture, large right pleural effusion with near complete collapse of the right lower lobes and partial collapse of the right middle lobe.  Fluid density for the effusion appears to be water like rather than blood like per radiologist.  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  Assessment and Plan Multiple acute right hip fracture. Right-sided hemothorax-traumatic Possibility of an osteoporotic fracture cannot be ruled out given her history of osteoporosis. There is no hemothorax reported but possibility cannot be ruled out. Monitor on oxygen monitor on telemetry.  Ultrasound-guided thoracentesis tomorrow. EDP discussed with Dr. Donell Beers from trauma surgery who do not think that the patient requires a chest tube not think the patient requires trauma admission or consultation. Dr. Karsten Ro recommended admission to hospitalist service. Will monitor. PT OT consulted.  History of recurrent fall. X-ray hip, CT head, CT chest, CT C-spine and CT abdomen and pelvis were performed. Thankfully other than rib fractures no acute fractures. There is a chronic stable appearing T7 fracture. Monitor.  History of severe dementia. Monitor for agitation. As needed Ativan.  Continue Namenda.  Continue trazodone nightly.  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 Recommendation is for semiannual imaging. Monitor.  Goals of care conversation. Discussed with patient's son on the phone with regards to her goals of care. Patient's son was able to pull out her living will which she does mention DNR. And situation of the patient has a cardiac arrest son would like the patient to remain comfortable. Monitor for now.  Given that there is a risk for hemothorax currently holding off on chemical DVT prophylaxis  Adult failure to thrive. Present on admission. In the setting of  severe dementia. Appears cachectic.  Placing the patient at high risk of poor outcome.  Advance Care Planning:   Code Status: DNR  Consults: EDP discussed with trauma surgery. Family Communication: Discussed with son on the phone.  Author: Lynden Oxford, MD 01/11/2022 6:50 PM For on call review [email or web address].

## 2022-01-11 NOTE — ED Provider Notes (Signed)
Diana Horton Provider Note   CSN: [id number] Arrival date & time: 01/11/22  1157     History  Chief Complaint  [facility]   Marletta LorFall    Diana Horton is a 81 y.o. female.  81 year old female with prior medical history as detailed below [facility]   [facility].  Patient apparently was ambulating as per her normal.  The patient turned around a quarter and the aide heard her fall.  Patient did strike her head.  No reported LOC.  Patient was out of the 8 site for approximately 2 to 3 minutes when the fall occurred.  Aide reports that the patient does fall fairly frequently.  Patient is comfortable and without specific complaint on evaluation.  She does have a history of dementia.  She cannot recall any events leading to her arrival here today.  Case briefly discussed with the patient's son Theron Aristaeter [phone].  He was unaware that the patient [facility].  He does report that the patient is full code.  Patient does have a history of frequent falls.      The history is provided by the patient, the nursing home, medical records and the EMS personnel.       Home Medications Prior to Admission medications   Medication Sig Start Date End Date Taking? Authorizing Provider  acetaminophen (TYLENOL) 500 MG tablet Take by mouth.    [provider]  alendronate (FOSAMAX) 70 MG tablet Take by mouth. 05/15/20   [provider]  cholecalciferol (VITAMIN D) 25 MCG (1000 UNIT) tablet Take by mouth.    [provider]  cyanocobalamin 1000 MCG tablet Take 1 tablet by mouth daily. 05/09/19   [provider]  doxycycline (VIBRAMYCIN) 100 MG capsule Take by mouth. 06/24/21   [provider]  folic acid (FOLVITE) 1 MG tablet Take by mouth. 01/16/20   [provider]  ipratropium (ATROVENT) 0.06 %  nasal spray  06/20/15   [provider]  LORazepam (ATIVAN) 0.5 MG tablet Take 0.5 mg by mouth every 8 (eight) hours as needed. 05/26/21   [provider]  memantine (NAMENDA) 5 MG tablet Take by mouth. 03/31/21   [provider]  potassium chloride SA (KLOR-CON M) 20 MEQ tablet Take 1 tablet (20 mEq total) by mouth daily for 5 days. 09/13/21 09/18/21  Mardene SayerBranham, Victoria C, MD  traZODone (DESYREL) 50 MG tablet Take by mouth. 06/09/20   [provider]      Allergies    Other    Review of Systems   Review of Systems  All other systems reviewed and are negative.   Physical Exam Updated Vital Signs BP (!) 146/89   Pulse 82   Temp 98.2 F (36.8 C)   Resp 15   SpO2 95%  Physical Exam Vitals and nursing note reviewed.  Constitutional:      General: She is not in acute distress.    Appearance: Normal appearance. She is well-developed.  HENT:     Head: Normocephalic.     Comments: Superficial contusion with superficial abrasion noted to the left posterior scalp.  No active bleeding noted.  No laceration requiring repair noted. Eyes:     Conjunctiva/sclera: Conjunctivae normal.     Pupils: Pupils are equal, round, and reactive to light.  Cardiovascular:     Rate and Rhythm: Normal rate and  regular rhythm.     Heart sounds: Normal heart sounds.  Pulmonary:     Effort: Pulmonary effort is normal. No respiratory distress.     Comments: decreased breath sounds at right base Abdominal:     General: There is no distension.     Palpations: Abdomen is soft.     Tenderness: There is no abdominal tenderness.  Musculoskeletal:        General: No deformity. Normal range of motion.     Cervical back: Normal range of motion and neck supple.  Skin:    General: Skin is warm and dry.  Neurological:     General: No focal deficit present.     Mental Status: She is alert.     Comments: Alert, without complaint, demented, at baseline     ED Results / Procedures /  Treatments   Labs (all labs ordered are listed, but only abnormal results are displayed) Labs Reviewed  CULTURE, BLOOD (ROUTINE X 2)  CULTURE, BLOOD (ROUTINE X 2)  RESP PANEL BY RT-PCR (RSV, FLU A&B, COVID)  RVPGX2  CBC WITH DIFFERENTIAL/PLATELET  BASIC METABOLIC PANEL  LACTIC ACID, PLASMA  LACTIC ACID, PLASMA  URINALYSIS, ROUTINE W REFLEX MICROSCOPIC  TYPE AND SCREEN  TROPONIN I (HIGH SENSITIVITY)    EKG EKG Interpretation  Date/Time:  Sunday January 11 2022 14:20:20 EST Ventricular Rate:  95 PR Interval:  148 QRS Duration: 89 QT Interval:  376 QTC Calculation: 473 R Axis:   -26 Text Interpretation: Sinus tachycardia Ventricular tachycardia, unsustained Aberrant conduction of SV complex(es) Borderline left axis deviation Borderline repolarization abnormality Baseline wander in lead(s) V3 Confirmed by Kristine Royal [id number]) on 01/11/2022 2:26:24 PM  Radiology DG Chest Port 1 View  Result Date: 01/11/2022 CLINICAL DATA:  Unwitnessed fall EXAM: PORTABLE CHEST 1 VIEW COMPARISON:  12/11/2021 FINDINGS: Stable cardiomegaly. Aortic atherosclerosis. Moderate-large right pleural effusion with associated right basilar opacity. Left lung appears clear. No pneumothorax. Multiple acute right-sided rib fractures including the posterolateral right fourth through eighth ribs, several of which are moderately displaced. The right seventh rib is fractured in 2 places. IMPRESSION: 1. Multiple acute right-sided rib fractures. No pneumothorax is identified. 2. Moderate-large right pleural effusion may represent hemothorax in the setting of trauma. Further assessment with dedicated CT of the chest is recommended. Electronically Signed   By: Duanne Guess D.O.   On: 01/11/2022 13:37   CT Head Wo Contrast  Result Date: 01/11/2022 CLINICAL DATA:  81 year old female status post unwitnessed fall. EXAM: CT HEAD WITHOUT CONTRAST TECHNIQUE: Contiguous axial images were obtained from the base of the skull  through the vertex without intravenous contrast. RADIATION DOSE REDUCTION: This exam was performed according to the departmental dose-optimization program which includes automated exposure control, adjustment of the mA and/or kV according to patient size and/or use of iterative reconstruction technique. COMPARISON:  Head CT 12/11/2021. FINDINGS: Brain: Stable cerebral volume. No midline shift, ventriculomegaly, mass effect, evidence of mass lesion, intracranial hemorrhage or evidence of cortically based acute infarction. Patchy and confluent bilateral cerebral white matter hypodensity appears stable. Vascular: Mild Calcified atherosclerosis at the skull base. No suspicious intracranial vascular hyperdensity. Skull: No acute osseous abnormality identified. Sinuses/Orbits: Visualized paranasal sinuses and mastoids are clear. Other: Mild left posterior convexity scalp hematoma on series 3, image 26. Underlying calvarium appears stable and intact. No scalp soft tissue gas. Stable orbits. IMPRESSION: 1. Left posterior convexity scalp hematoma without underlying skull fracture. 2. No acute intracranial abnormality. Stable mild to moderate for age cerebral white matter  changes. Electronically Signed   By: Odessa Fleming M.D.   On: 01/11/2022 12:55   DG Hip Unilat With Pelvis 2-3 Views Left  Result Date: 01/11/2022 CLINICAL DATA:  Fall.  Left hip pain EXAM: DG HIP (WITH OR WITHOUT PELVIS) 2-3V LEFT COMPARISON:  09/13/2021 FINDINGS: Status post left total hip arthroplasty. Arthroplasty components are in their expected alignment without periprosthetic lucency or fracture. Prior right femoral ORIF without apparent complication. Pelvic bony ring intact without evidence of fracture or diastasis. Severe lower lumbar spondylosis. No appreciable soft tissue abnormality. IMPRESSION: Negative. Electronically Signed   By: Duanne Guess D.O.   On: 01/11/2022 12:51    Procedures Procedures    Medications Ordered in  ED Medications  Tdap (BOOSTRIX) injection 0.5 mL (has no administration in time range)    ED Course/ Medical Decision Making/ A&P                           Medical Decision Making Amount and/or Complexity of Data Reviewed Labs: ordered. Radiology: ordered.  Risk Prescription drug management.    Medical Screen Complete  This patient presented to the ED with complaint of fall.  This complaint involves an extensive number of treatment options. The initial differential diagnosis includes, but is not limited to, trauma related to fall, metabolic abnormality, etc.  This presentation is: Acute, Chronic, Self-Limited, Previously Undiagnosed, Uncertain Prognosis, Complicated, Systemic Symptoms, and Threat to Life/Bodily Function  Patient presents from [facility] for evaluation after apparent ground-level fall.  Patient with ground-level fall earlier today.  Patient did strike her head.  LOC was not reported by staff.    Patient with minimal to no complaint on initial evaluation.  Notable abnormality found on chest x-ray with large right-sided pleural effusion and multiple right-sided rib fractures.  Patient is without hypoxia, respiratory distress, complaint of pain to the right chest wall.  CT imaging reveals multiple right-sided rib fractures with large pleural effusion.  Density of pleural effusion is not consistent with hemorrhage.  Case discussed with general surgery Dr. Donell Beers.  She agrees that patient would probably not benefit from placement of chest tube. Donell Beers feels that patient best served by medical admission.  Patient likely would benefit from admission for possible thoracentesis with IR.    Patient's son [name]) made aware of case and findings on workup.  He agrees with plan of care.  Hospitalist service made aware of case and will evaluate for admission.   Additional history obtained:  Additional history obtained from [facility] and Caregiver External records  from outside sources obtained and reviewed including prior ED visits and prior Inpatient records.    Lab Tests:  I ordered and personally interpreted labs.  The pertinent results include: BC, BMP, troponin, lactic acid, covid, flu, RSV    Imaging Studies ordered:  I ordered imaging studies including CT Head, C-spine, Chest, AP, XR of left hip, cxr  I independently visualized and interpreted obtained imaging which showed pleural effusion, multiple rib fractures I agree with the radiologist interpretation.   Cardiac Monitoring:  The patient was maintained on a cardiac monitor.  I personally viewed and interpreted the cardiac monitor which showed an underlying rhythm of: NSR   Medicines ordered:  I ordered medication including tdap  for prophylaxis  Reevaluation of the patient after these medicines showed that the patient: improved  Problem List / ED Course:  Fall, head injury, multiple right-sided rib fractures, pleural effusion   Reevaluation:  After  the interventions noted above, I reevaluated the patient and found that they have: improved   Disposition:  After consideration of the diagnostic results and the patients response to treatment, I feel that the patent would benefit from admission.          Final Clinical Impression(s) / ED Diagnoses Final diagnoses:  Fall, initial encounter  Closed fracture of multiple ribs of right side, initial encounter    Rx / DC Orders ED Discharge Orders     None         Wynetta Fines, MD 01/11/22 [phone]

## 2022-01-11 NOTE — ED Notes (Signed)
2 briefs removed from patient at this time. Perineal care complete. Pure wick placed on pt.

## 2022-01-12 ENCOUNTER — Observation Stay (HOSPITAL_COMMUNITY): Payer: Medicare HMO

## 2022-01-12 ENCOUNTER — Encounter (HOSPITAL_COMMUNITY): Payer: Self-pay | Admitting: Internal Medicine

## 2022-01-12 DIAGNOSIS — D62 Acute posthemorrhagic anemia: Secondary | ICD-10-CM | POA: Diagnosis present

## 2022-01-12 DIAGNOSIS — G9341 Metabolic encephalopathy: Secondary | ICD-10-CM | POA: Diagnosis not present

## 2022-01-12 DIAGNOSIS — F05 Delirium due to known physiological condition: Secondary | ICD-10-CM | POA: Diagnosis present

## 2022-01-12 DIAGNOSIS — F0390 Unspecified dementia without behavioral disturbance: Secondary | ICD-10-CM | POA: Diagnosis not present

## 2022-01-12 DIAGNOSIS — Z79899 Other long term (current) drug therapy: Secondary | ICD-10-CM | POA: Diagnosis not present

## 2022-01-12 DIAGNOSIS — I7121 Aneurysm of the ascending aorta, without rupture: Secondary | ICD-10-CM | POA: Diagnosis present

## 2022-01-12 DIAGNOSIS — S271XXA Traumatic hemothorax, initial encounter: Secondary | ICD-10-CM | POA: Diagnosis present

## 2022-01-12 DIAGNOSIS — Z1152 Encounter for screening for COVID-19: Secondary | ICD-10-CM | POA: Diagnosis not present

## 2022-01-12 DIAGNOSIS — Z66 Do not resuscitate: Secondary | ICD-10-CM | POA: Diagnosis present

## 2022-01-12 DIAGNOSIS — E162 Hypoglycemia, unspecified: Secondary | ICD-10-CM | POA: Diagnosis present

## 2022-01-12 DIAGNOSIS — J942 Hemothorax: Secondary | ICD-10-CM | POA: Diagnosis present

## 2022-01-12 DIAGNOSIS — R531 Weakness: Secondary | ICD-10-CM | POA: Diagnosis not present

## 2022-01-12 DIAGNOSIS — W010XXA Fall on same level from slipping, tripping and stumbling without subsequent striking against object, initial encounter: Secondary | ICD-10-CM | POA: Diagnosis present

## 2022-01-12 DIAGNOSIS — Z23 Encounter for immunization: Secondary | ICD-10-CM | POA: Diagnosis present

## 2022-01-12 DIAGNOSIS — Z681 Body mass index (BMI) 19 or less, adult: Secondary | ICD-10-CM | POA: Diagnosis not present

## 2022-01-12 DIAGNOSIS — Z7189 Other specified counseling: Secondary | ICD-10-CM | POA: Diagnosis not present

## 2022-01-12 DIAGNOSIS — N390 Urinary tract infection, site not specified: Secondary | ICD-10-CM | POA: Diagnosis not present

## 2022-01-12 DIAGNOSIS — E43 Unspecified severe protein-calorie malnutrition: Secondary | ICD-10-CM | POA: Diagnosis present

## 2022-01-12 DIAGNOSIS — S22069A Unspecified fracture of T7-T8 vertebra, initial encounter for closed fracture: Secondary | ICD-10-CM | POA: Diagnosis present

## 2022-01-12 DIAGNOSIS — S2241XA Multiple fractures of ribs, right side, initial encounter for closed fracture: Secondary | ICD-10-CM | POA: Diagnosis present

## 2022-01-12 DIAGNOSIS — J90 Pleural effusion, not elsewhere classified: Secondary | ICD-10-CM | POA: Diagnosis present

## 2022-01-12 DIAGNOSIS — J9811 Atelectasis: Secondary | ICD-10-CM | POA: Diagnosis present

## 2022-01-12 DIAGNOSIS — R64 Cachexia: Secondary | ICD-10-CM | POA: Diagnosis present

## 2022-01-12 DIAGNOSIS — W19XXXA Unspecified fall, initial encounter: Secondary | ICD-10-CM

## 2022-01-12 DIAGNOSIS — Z781 Physical restraint status: Secondary | ICD-10-CM | POA: Diagnosis not present

## 2022-01-12 DIAGNOSIS — Z515 Encounter for palliative care: Secondary | ICD-10-CM | POA: Diagnosis not present

## 2022-01-12 DIAGNOSIS — Y92239 Unspecified place in hospital as the place of occurrence of the external cause: Secondary | ICD-10-CM | POA: Diagnosis not present

## 2022-01-12 DIAGNOSIS — B9620 Unspecified Escherichia coli [E. coli] as the cause of diseases classified elsewhere: Secondary | ICD-10-CM | POA: Diagnosis not present

## 2022-01-12 DIAGNOSIS — F03C11 Unspecified dementia, severe, with agitation: Secondary | ICD-10-CM | POA: Diagnosis present

## 2022-01-12 DIAGNOSIS — R627 Adult failure to thrive: Secondary | ICD-10-CM | POA: Diagnosis present

## 2022-01-12 DIAGNOSIS — H9190 Unspecified hearing loss, unspecified ear: Secondary | ICD-10-CM | POA: Diagnosis present

## 2022-01-12 LAB — COMPREHENSIVE METABOLIC PANEL
ALT: 17 U/L (ref 0–44)
AST: 20 U/L (ref 15–41)
Albumin: 3.6 g/dL (ref 3.5–5.0)
Alkaline Phosphatase: 83 U/L (ref 38–126)
Anion gap: 11 (ref 5–15)
BUN: 27 mg/dL — ABNORMAL HIGH (ref 8–23)
CO2: 23 mmol/L (ref 22–32)
Calcium: 9 mg/dL (ref 8.9–10.3)
Chloride: 106 mmol/L (ref 98–111)
Creatinine, Ser: 0.92 mg/dL (ref 0.44–1.00)
GFR, Estimated: >60 mL/min (ref >60)
Glucose, Bld: 83 mg/dL (ref 70–99)
Potassium: 3.4 mmol/L — ABNORMAL LOW (ref 3.5–5.1)
Sodium: 140 mmol/L (ref 135–145)
Total Bilirubin: 0.9 mg/dL (ref 0.3–1.2)
Total Protein: 6.6 g/dL (ref 6.5–8.1)

## 2022-01-12 LAB — CBC
HCT: 30.5 % — ABNORMAL LOW (ref 36.0–46.0)
Hemoglobin: 9.4 g/dL — ABNORMAL LOW (ref 12.0–15.0)
MCH: 27.9 pg (ref 26.0–34.0)
MCHC: 30.8 g/dL (ref 30.0–36.0)
MCV: 90.5 fL (ref 80.0–100.0)
Platelets: 406 10*3/uL — ABNORMAL HIGH (ref 150–400)
RBC: 3.37 MIL/uL — ABNORMAL LOW (ref 3.87–5.11)
RDW: 16.8 % — ABNORMAL HIGH (ref 11.5–15.5)
WBC: 9.2 10*3/uL (ref 4.0–10.5)
nRBC: 0 % (ref 0.0–0.2)

## 2022-01-12 LAB — BODY FLUID CELL COUNT WITH DIFFERENTIAL
Lymphs, Fluid: 11 %
Monocyte-Macrophage-Serous Fluid: 48 % — ABNORMAL LOW (ref 50–90)
Neutrophil Count, Fluid: 41 % — ABNORMAL HIGH (ref 0–25)
Total Nucleated Cell Count, Fluid: 1421 cu mm — ABNORMAL HIGH (ref 0–1000)

## 2022-01-12 LAB — LACTATE DEHYDROGENASE, PLEURAL OR PERITONEAL FLUID: LD, Fluid: 415 U/L — ABNORMAL HIGH (ref 3–23)

## 2022-01-12 LAB — PROTEIN, PLEURAL OR PERITONEAL FLUID: Total protein, fluid: 4.5 g/dL

## 2022-01-12 MED ORDER — LIDOCAINE HCL 1 % IJ SOLN
INTRAMUSCULAR | Status: AC
  Administered 2022-01-12: 10 mL
  Filled 2022-01-12: qty 20

## 2022-01-12 NOTE — Progress Notes (Signed)
OT Cancellation Note  Patient Details Name: Diana Horton MRN: [id number] DOB: [birth year]   Cancelled Treatment:    Reason Eval/Treat Not Completed: Fatigue/lethargy limiting ability to participate Per nurse, Patient remains drowsy this AM with noted ativan administration last PM. OT to continue to follow and check back as schedule will allow.  Rosalio Loud, MS Acute Rehabilitation Department Office# [phone]  01/12/2022, 9:00 AM

## 2022-01-12 NOTE — Progress Notes (Signed)
MD Allena Katz at bedside. New orders for X-ray of chest, hip, and spine.   01/12/22 1620  What Happened  Was fall witnessed? No  Patient found on floor  Found by Staff-comment Jorene Guest RN)  Stated prior activity to/from bed, chair, or stretcher  Provider Notification  Provider Name/Title Dr. Allena Katz  Date Provider Notified 01/12/22  Time Provider Notified 1620  Method of Notification Page  Notification Reason Fall  Provider response At bedside;See new orders  Date of Provider Response 01/12/22  Time of Provider Response 1627  Follow Up  Family notified Yes - comment  Time family notified 1645  Additional tests Yes-comment  Adult Fall Risk Assessment  Risk Factor Category (scoring not indicated) Fall has occurred during this admission (document High fall risk)  Patient Fall Risk Level High fall risk  Adult Fall Risk Interventions  Required Bundle Interventions *See Row Information* High fall risk - low, moderate, and high requirements implemented  Additional Interventions PT/OT need assessed if change in mobility from baseline;Use of appropriate toileting equipment (bedpan, BSC, etc.)  Screening for Fall Injury Risk (To be completed on HIGH fall risk patients) - Assessing Need for Floor Mats  Risk For Fall Injury- Criteria for Floor Mats Previous fall this admission  Will Implement Floor Mats Yes  Oxygen Therapy  SpO2 93 %  O2 Device Nasal Cannula  O2 Flow Rate (L/min) 2 L/min  Pain Assessment  Pain Scale 0-10  Pain Score 0  Neurological  Neuro (WDL) X  Level of Consciousness Alert  Orientation Level Oriented to person;Oriented to situation;Disoriented to place;Disoriented to time  Education officer, museum Clear  R Pupil Size (mm) 4  R Pupil Shape Round  L Pupil Size (mm) 4  L Pupil Shape Round  RUE Motor Strength 4  LUE Motor Strength 4  RLE Motor Strength 4  LLE Motor Strength 4  Neuro Symptoms Forgetful  Musculoskeletal   Musculoskeletal (WDL) X  Assistive Device BSC;Front wheel walker  Generalized Weakness Yes  Weight Bearing Restrictions No  Integumentary  Integumentary (WDL) X  Skin Color Appropriate for ethnicity  Skin Condition Dry  Skin Integrity Abrasion;Ecchymosis  Abrasion Location Arm;Leg  Abrasion Location Orientation Right;Left  Ecchymosis Location Hip  Ecchymosis Location Orientation Left

## 2022-01-12 NOTE — Procedures (Signed)
PROCEDURE SUMMARY:  Successful US guided right thoracentesis. Yielded 150cc of bloody fluid. Pt tolerated procedure well. No immediate complications.  Specimen was sent for labs. CXR ordered.  EBL < 5 mL  Anastasia Tompson PA-C 01/12/2022 10:52 AM

## 2022-01-12 NOTE — Progress Notes (Signed)
[facility] Progress Note Patient: Diana Horton TFT:[id number] DOB: [birth year] DOA: 01/11/2022  DOS: the patient was seen and examined on 01/12/2022  Brief hospital course: TERESE HEIER is a 81 y.o. female with PMH significant of dementia osteoporosis, B12 deficiency presenting to the hospital with a fall. Patient is a resident at [facility] memory care unit. Had a fall which was unwitnessed. Unfortunately patient sustained another fall on 12/18 in the hospital which was not witnessed. Assessment and Plan: Multiple acute right rib fracture. Subacute/chronic right-sided hemothorax-traumatic History of osteoporosis  Mostly pathological fracture as that is no evidence of significant trauma from the outside. Underwent thoracentesis.  There is appearance of a significant complex fluid with some fibrinous exudate. Only diagnostic paracentesis was performed. EDP discussed with Dr. Donell Beers from trauma surgery who do not think that the patient requires a chest tube and not think the patient requires trauma admission or consultation and recommended admission to hospitalist service. Continue pain control.  Supportive measures with oxygen therapy and incentive spirometry. If the patient reports severe pain or becomes hemodynamically unstable or hypoxic at which point we will consider discussing with trauma surgery/CCM for chest tube placement.  History of recurrent fall. X-ray hip, CT head, CT chest, CT C-spine and CT abdomen and pelvis were performed. Thankfully other than rib fractures no acute fractures. There is a chronic stable appearing T7 fracture. Unfortunately had another an unwitnessed fall in the hospital on 12/18. Reporting some lower back pain as well as left hip pain. X-ray performed.  Currently results pending. Monitor.  History of severe dementia. Monitor for agitation.  Patient currently able to tell me her name and where she is right now and able to follow all  commands without any focal deficit. As needed Ativan.  Continue Namenda.  Continue trazodone nightly.   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 Recommendation is for semiannual imaging. Monitor.   Goals of care conversation. Discussed with patient's son on the phone with regards to her goals of care. Patient's son was able to pull out her living will which she does mention DNR. And situation of the patient has a cardiac arrest son would like the patient to remain comfortable. Monitor for now.   Given that there is a risk for hemothorax currently holding off on chemical DVT prophylaxis   Adult failure to thrive. Present on admission. In the setting of severe dementia. Appears cachectic.  Placing the patient at high risk of poor outcome. Dietitian consulted.   Subjective: No nausea no vomiting.  No fever no chills.  Had another fall.  Reports left leg pain as well as lower back pain.  Physical Exam: General: in mild distress;  Cardiovascular: S1 and S2 Present, no Murmur Respiratory: Increased respiratory effort, Bilateral Air entry present, bilateral crackles, no wheezes Abdomen: Bowel Sound present, Non tender  Extremities: No edema Neurology: alert and oriented to place, and person   Data Reviewed: I have Reviewed nursing notes, Vitals, and Lab results. Since last encounter, pertinent lab results CBC and BMP   . I have ordered test including CBC and BMP  . I have discussed pt's care plan and test results with IR after procedure  . I have ordered imaging x-ray chest hip and lumbar spine  .   Disposition: Status is: Observation  SCDs Start: 01/11/22 1816 Place TED hose Start: 01/11/22 1816   Family Communication: Discussed with son on the phone Level of care: Telemetry Continue telemetry Vitals:  01/12/22 1021 01/12/22 1406 01/12/22 1615 01/12/22 1620  BP: (!) 129/94 123/81 (!) 128/90   Pulse:  91 (!) 103   Resp:  18    Temp:  98.3 F  (36.8 C)    TempSrc:  Oral    SpO2:  96% (!) 87% 93%     Author: Lynden Oxford, MD 01/12/2022 6:24 PM  Please look on [email or web address] to find out who is on call.

## 2022-01-12 NOTE — Progress Notes (Signed)
Pt is confused, alert to self. Pt is also impulsive, attempting to exit bed and pulling lines and tubing, refusing to keep heart monitor on. Pt mediated per Mar and toileted often . Pt re-oriented to surroundings but remains impulsive, forgetful, and confused. Safety bundle continued.

## 2022-01-12 NOTE — Hospital Course (Signed)
Diana Horton is a 81 y.o. female with PMH significant of dementia osteoporosis, B12 deficiency presenting to the hospital with a fall. Patient is a resident at [facility] memory care unit. Had a fall which was unwitnessed. Unfortunately patient sustained another fall on 12/18 in the hospital which was not witnessed.

## 2022-01-12 NOTE — Progress Notes (Signed)
PT Cancellation Note  Patient Details Name: Diana Horton MRN: [id number] DOB: [birth year]   Cancelled Treatment:    Reason Eval/Treat Not Completed: Fatigue/lethargy limiting ability to participate Per nurse, Patient remains drowsy this AM with noted ativan administration last PM. OT to continue to follow and check back as schedule will allow.    [facility] 01/12/2022, 9:04 AM

## 2022-01-12 NOTE — Evaluation (Signed)
Occupational Therapy Evaluation Patient Details Name: Diana Horton MRN: [id number] DOB: 29-Apr-1940 Today's Date: 01/12/2022   History of Present Illness Patient is a 81 year old female who presented after a fall at memory care unit. Patient developed a L side hematoma and sent to the hospital. 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 Patient underwent thoracentesis yielding 12500ccs on 12/18. PMH: dementia, B12 deficiency, osteoporosis.   Clinical Impression   Patient is a 81 year old female who was admitted for above. Patient was living at memory care with no AD prior level. Currently, patient is min A for transfers with increased time and increased difficulty with hearing cues. Patient is pleasant and cooperative if able to hear the cues. Patient was noted to have decreased functional activity tolerance, decreased endurance, decreased standing balance, decreased safety awareness, and decreased knowledge of AD/AE impacting participation in ADLs. Patient would continue to benefit from skilled OT services at this time while admitted and after d/c to address noted deficits in order to improve overall safety and independence in ADLs.        Recommendations for follow up therapy are one component of a multi-disciplinary discharge planning process, led by the attending physician.  Recommendations may be updated based on patient status, additional functional criteria and insurance authorization.   Follow Up Recommendations  Home health OT     Assistance Recommended at Discharge Frequent or constant Supervision/Assistance  Patient can return home with the following A little help with walking and/or transfers;A lot of help with bathing/dressing/bathroom;Assistance with cooking/housework;Direct supervision/assist for medications management;Assist for transportation;Help with stairs or ramp for  entrance;Direct supervision/assist for financial management    Functional Status Assessment  Patient has had a recent decline in their functional status and demonstrates the ability to make significant improvements in function in a reasonable and predictable amount of time.  Equipment Recommendations  None recommended by OT    Recommendations for Other Services       Precautions / Restrictions Precautions Precaution Comments: R side rib fractures, L side hematoma Restrictions Weight Bearing Restrictions: No      Mobility Bed Mobility Overal bed mobility: Needs Assistance Bed Mobility: Supine to Sit     Supine to sit: Min guard     General bed mobility comments: with increased time and increased cues.    Transfers                          Balance Overall balance assessment: Mild deficits observed, not formally tested                                         ADL either performed or assessed with clinical judgement   ADL Overall ADL's : Needs assistance/impaired Eating/Feeding: Supervision/ safety;Sitting Eating/Feeding Details (indicate cue type and reason): noted to have spillage and poor awareness but able to eat majority of plate sitting in bed. session started after patient had been finishing last bits of meal. need A for small container set up Grooming: Brushing hair;Set up;Sitting Grooming Details (indicate cue type and reason): in recliner Upper Body Bathing: Min guard;Sitting   Lower Body Bathing: Minimal assistance;Sitting/lateral leans;Sit to/from stand   Upper Body Dressing : Min guard;Sitting   Lower Body Dressing: Minimal assistance;Sit to/from stand;Sitting/lateral leans   Toilet  Transfer: Minimal assistance;Ambulation Toilet Transfer Details (indicate cue type and reason): to 3 in 1 commode with increased time and no AD noted to be unsteady at times. patient was plesantly confused sitting on commode and even kickec feet up  onto bed crossed to "get comfortable". patient then transitioned to recliner with patient needing min A to finish turn to compelte transfer into recliner. Toileting- Clothing Manipulation and Hygiene: Minimal assistance;Sit to/from stand Toileting - Clothing Manipulation Details (indicate cue type and reason): with some unsteadiness noted     Functional mobility during ADLs: Minimal assistance       Vision   Vision Assessment?: No apparent visual deficits     Perception     Praxis      Pertinent Vitals/Pain Pain Assessment Pain Assessment: Faces Faces Pain Scale: Hurts little more Pain Location: with movement Pain Intervention(s): Limited activity within patient's tolerance, Monitored during session     Hand Dominance     Extremity/Trunk Assessment Upper Extremity Assessment Upper Extremity Assessment: Overall WFL for tasks assessed   Lower Extremity Assessment Lower Extremity Assessment: Defer to PT evaluation   Cervical / Trunk Assessment Cervical / Trunk Assessment: Normal   Communication     Cognition Arousal/Alertness: Awake/alert Behavior During Therapy: WFL for tasks assessed/performed Overall Cognitive Status: Difficult to assess                                 General Comments: patient was appropirate when she understood the cues and prompts. has a very hard time hearing.knows she was in the hosptial     General Comments       Exercises     Shoulder Instructions      Home Living Family/patient expects to be discharged to:: Assisted living                             Home Equipment: None          Prior Functioning/Environment Prior Level of Function : Needs assist  Cognitive Assist : ADLs (cognitive);Mobility (cognitive) Mobility (Cognitive): Intermittent cues ADLs (Cognitive): Intermittent cues         ADLs Comments: patient had hard time hearing therapist and was difficult to communicate PLOF. patient also  noted to have h/o dementia from memory care unit per hospitalist note. no family in room at this time.        OT Problem List: Decreased activity tolerance;Impaired balance (sitting and/or standing);Decreased safety awareness;Decreased knowledge of precautions;Decreased knowledge of use of DME or AE      OT Treatment/Interventions: Self-care/ADL training;Energy conservation;DME and/or AE instruction;Therapeutic activities;Patient/family education;Balance training    OT Goals(Current goals can be found in the care plan section) Acute Rehab OT Goals Patient Stated Goal: to go to the bathroom OT Goal Formulation: Patient unable to participate in goal setting Time For Goal Achievement: 01/26/22 Potential to Achieve Goals: Fair  OT Frequency: Min 2X/week    Co-evaluation              AM-PAC OT "6 Clicks" Daily Activity     Outcome Measure Help from another person eating meals?: A Little Help from another person taking care of personal grooming?: A Little Help from another person toileting, which includes using toliet, bedpan, or urinal?: A Little Help from another person bathing (including washing, rinsing, drying)?: A Little Help from another person to put on and taking off  regular upper body clothing?: A Little Help from another person to put on and taking off regular lower body clothing?: A Little 6 Click Score: 18   End of Session Equipment Utilized During Treatment: Gait belt Nurse Communication: Other (comment) (ok to participate in session and then nurse updated on patients positioning after session.)  Activity Tolerance: Patient tolerated treatment well Patient left: in chair;with call bell/phone within reach;with chair alarm set  OT Visit Diagnosis: Unsteadiness on feet (R26.81);Other abnormalities of gait and mobility (R26.89)                Time: 1916-6060 OT Time Calculation (min): 24 min Charges:  OT General Charges $OT Visit: 1 Visit OT Evaluation $OT Eval Low  Complexity: 1 Low OT Treatments $Self Care/Home Management : 8-22 mins  Rosalio Loud, MS Acute Rehabilitation Department Office# [phone]   Selinda Flavin 01/12/2022, 4:04 PM

## 2022-01-13 ENCOUNTER — Encounter (HOSPITAL_COMMUNITY): Payer: Self-pay | Admitting: Internal Medicine

## 2022-01-13 DIAGNOSIS — J942 Hemothorax: Secondary | ICD-10-CM | POA: Diagnosis not present

## 2022-01-13 LAB — BASIC METABOLIC PANEL
Anion gap: 7 (ref 5–15)
BUN: 24 mg/dL — ABNORMAL HIGH (ref 8–23)
CO2: 26 mmol/L (ref 22–32)
Calcium: 8.3 mg/dL — ABNORMAL LOW (ref 8.9–10.3)
Chloride: 106 mmol/L (ref 98–111)
Creatinine, Ser: 0.81 mg/dL (ref 0.44–1.00)
GFR, Estimated: >60 mL/min (ref >60)
Glucose, Bld: 101 mg/dL — ABNORMAL HIGH (ref 70–99)
Potassium: 3.6 mmol/L (ref 3.5–5.1)
Sodium: 139 mmol/L (ref 135–145)

## 2022-01-13 LAB — CBC
HCT: 30.5 % — ABNORMAL LOW (ref 36.0–46.0)
Hemoglobin: 9.3 g/dL — ABNORMAL LOW (ref 12.0–15.0)
MCH: 27.6 pg (ref 26.0–34.0)
MCHC: 30.5 g/dL (ref 30.0–36.0)
MCV: 90.5 fL (ref 80.0–100.0)
Platelets: 377 10*3/uL (ref 150–400)
RBC: 3.37 MIL/uL — ABNORMAL LOW (ref 3.87–5.11)
RDW: 16.7 % — ABNORMAL HIGH (ref 11.5–15.5)
WBC: 7.7 10*3/uL (ref 4.0–10.5)
nRBC: 0 % (ref 0.0–0.2)

## 2022-01-13 MED ORDER — ENSURE ENLIVE PO LIQD
237.0000 mL | Freq: Two times a day (BID) | ORAL | Status: DC
  Administered 2022-01-13 – 2022-01-25 (×8): 237 mL via ORAL

## 2022-01-13 MED ORDER — HALOPERIDOL LACTATE 5 MG/ML IJ SOLN
2.5000 mg | Freq: Four times a day (QID) | INTRAMUSCULAR | Status: AC | PRN
  Administered 2022-01-13 – 2022-01-14 (×2): 2.5 mg via INTRAVENOUS
  Filled 2022-01-13 (×2): qty 1

## 2022-01-13 NOTE — Progress Notes (Signed)
[facility] Progress Note Patient: Diana Horton JOA:[id number] DOB: 05-Mar-1940 DOA: 01/11/2022  DOS: the patient was seen and examined on 01/13/2022  Brief hospital course: Diana Horton is a 81 y.o. female with PMH significant of dementia osteoporosis, B12 deficiency presenting to the hospital with a fall. Patient is a resident at [facility] memory care unit. Had a fall which was unwitnessed. Unfortunately patient sustained another fall on 12/18 in the hospital which was not witnessed. Currently awaiting improvement in oral intake and mentation.  Assessment and Plan: Multiple acute right rib fracture. Subacute/chronic right-sided hemothorax-traumatic History of osteoporosis  Mostly pathological fracture as that is no evidence of significant trauma from the outside. Underwent thoracentesis. There is appearance of a significant complex fluid with some fibrinous exudate. With the concern from causing an ex vacuo pneumo, only diagnostic paracentesis was performed. Earlier EDP discussed with Dr. Donell Beers from trauma surgery who do not think that the patient requires a chest tube. Patient currently not on oxygen. Continue pain control. Supportive measures with oxygen therapy and incentive spirometry. If the patient reports severe pain or becomes hemodynamically unstable or hypoxic at which point we will consider discussing with trauma surgery/CCM for chest tube placement.  History of recurrent fall. X-ray hip, CT head, CT chest, CT C-spine and CT abdomen and pelvis were performed. Thankfully other than rib fractures no acute fractures. There is a chronic stable appearing T7 fracture. Unfortunately had another an unwitnessed fall in the hospital on 12/18. Reporting some lower back pain as well as left hip pain. X-ray performed.  So far currently no acute fractures. Monitor.  History of severe dementia. Monitor for agitation.   Patient currently able to tell me her name and  where she is right now and able to follow all commands without any focal deficit. As needed Ativan.   Continue Namenda.  Continue trazodone nightly.   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 Recommendation is for semiannual imaging. Monitor.   Goals of care conversation. Discussed with patient's son on the phone with regards to her goals of care. Patient's son was able to pull out her living will which she does mention DNR. And situation of the patient has a cardiac arrest son would like the patient to remain comfortable. Monitor for now.   Given that there is a risk for hemothorax currently holding off on chemical DVT prophylaxis   Adult failure to thrive. Present on admission. Body mass index is 15.31 kg/m.  In the setting of severe dementia. Appears cachectic.  Placing the patient at high risk of poor outcome. Dietitian consulted.   Subjective: No nausea no vomiting.  No fever no chills.  Denies any acute pain right now.  Some chest pain reported.  Sleepy and drowsy.  Physical Exam: General: Appear in mild distress; Cardiovascular: S1 and S2 Present, no Murmur, Respiratory: good respiratory effort, Bilateral Air entry present, right crackles, no wheezes Abdomen: Bowel Sound present, Non tender  Extremities: no Pedal edema Neurology: alert and oriented to self   Data Reviewed: I have Reviewed nursing notes, Vitals, and Lab results. Since last encounter, pertinent lab results CBC and BMP   . I have ordered test including CBC and BMP  .    Disposition: Status is: Inpatient. Currently waiting for improvement in mentation on oral intake.  SCDs Start: 01/11/22 1816 Place TED hose Start: 01/11/22 1816   Family Communication: Discussed with son on the phone 12/18 Level of care: Telemetry Continue telemetry Vitals:  01/13/22 0629 01/13/22 1503 01/13/22 1851 01/13/22 1932  BP: 132/81 (!) 130/91  (!) 148/96  Pulse: 79 84  94  Resp: 17 14  18    Temp: 97.7 F (36.5 C) 98 F (36.7 C)  98.2 F (36.8 C)  TempSrc: Oral Oral  Oral  SpO2: 91% 90%  95%  Weight:   45 kg   Height:   5' 7.5" (1.715 m)      Author: , MD 01/13/2022 8:28 PM  Please look on [email or web address] to find out who is on call.

## 2022-01-13 NOTE — Progress Notes (Signed)
PT Cancellation Note  Patient Details Name: SAE HANDRICH MRN: [id number] DOB: 11/16/1940   Cancelled Treatment:    Reason Eval/Treat Not Completed: Patient's level of consciousness;Fatigue/lethargy limiting ability to participate, RN requests to  to not awaken the patient. Will check back tomorrow.  Blanchard Kelch PT Acute Rehabilitation Services Office [phone] Weekend pager-[phone]    Rada Hay 01/13/2022, 1:54 PM

## 2022-01-14 DIAGNOSIS — E43 Unspecified severe protein-calorie malnutrition: Secondary | ICD-10-CM | POA: Insufficient documentation

## 2022-01-14 DIAGNOSIS — J942 Hemothorax: Secondary | ICD-10-CM | POA: Diagnosis not present

## 2022-01-14 LAB — BASIC METABOLIC PANEL
Anion gap: 4 — ABNORMAL LOW (ref 5–15)
BUN: 19 mg/dL (ref 8–23)
CO2: 28 mmol/L (ref 22–32)
Calcium: 8.5 mg/dL — ABNORMAL LOW (ref 8.9–10.3)
Chloride: 108 mmol/L (ref 98–111)
Creatinine, Ser: 0.87 mg/dL (ref 0.44–1.00)
GFR, Estimated: >60 mL/min (ref >60)
Glucose, Bld: 108 mg/dL — ABNORMAL HIGH (ref 70–99)
Potassium: 3.8 mmol/L (ref 3.5–5.1)
Sodium: 140 mmol/L (ref 135–145)

## 2022-01-14 LAB — CBC
HCT: 31.5 % — ABNORMAL LOW (ref 36.0–46.0)
Hemoglobin: 9.5 g/dL — ABNORMAL LOW (ref 12.0–15.0)
MCH: 27.5 pg (ref 26.0–34.0)
MCHC: 30.2 g/dL (ref 30.0–36.0)
MCV: 91.3 fL (ref 80.0–100.0)
Platelets: 366 10*3/uL (ref 150–400)
RBC: 3.45 MIL/uL — ABNORMAL LOW (ref 3.87–5.11)
RDW: 16.5 % — ABNORMAL HIGH (ref 11.5–15.5)
WBC: 7.5 10*3/uL (ref 4.0–10.5)
nRBC: 0 % (ref 0.0–0.2)

## 2022-01-14 LAB — T4, FREE: Free T4: 0.85 ng/dL (ref 0.61–1.12)

## 2022-01-14 LAB — TSH: TSH: 11.199 u[IU]/mL — ABNORMAL HIGH (ref 0.350–4.500)

## 2022-01-14 LAB — CYTOLOGY - NON PAP

## 2022-01-14 LAB — VITAMIN B12: Vitamin B-12: 1501 pg/mL — ABNORMAL HIGH (ref 180–914)

## 2022-01-14 MED ORDER — METOPROLOL TARTRATE 5 MG/5ML IV SOLN: 5.0000 mg | INTRAVENOUS | Status: DC | PRN

## 2022-01-14 MED ORDER — GUAIFENESIN 100 MG/5ML PO LIQD: 5.0000 mL | ORAL | Status: DC | PRN

## 2022-01-14 MED ORDER — SERTRALINE HCL 100 MG PO TABS
200.0000 mg | ORAL_TABLET | Freq: Every morning | ORAL | Status: DC
  Administered 2022-01-14 – 2022-01-27 (×13): 200 mg via ORAL
  Filled 2022-01-14 (×13): qty 2

## 2022-01-14 MED ORDER — HYDRALAZINE HCL 20 MG/ML IJ SOLN: 10.0000 mg | INTRAMUSCULAR | Status: DC | PRN

## 2022-01-14 MED ORDER — IPRATROPIUM-ALBUTEROL 0.5-2.5 (3) MG/3ML IN SOLN: 3.0000 mL | RESPIRATORY_TRACT | Status: DC | PRN

## 2022-01-14 MED ORDER — POLYETHYLENE GLYCOL 3350 17 G PO PACK
17.0000 g | PACK | Freq: Every day | ORAL | Status: DC | PRN
  Administered 2022-01-26: 17 g via ORAL
  Filled 2022-01-14: qty 1

## 2022-01-14 MED ORDER — MELATONIN 3 MG PO TABS
6.0000 mg | ORAL_TABLET | Freq: Every day | ORAL | Status: DC
  Administered 2022-01-14 – 2022-01-24 (×11): 6 mg via ORAL
  Filled 2022-01-14 (×11): qty 2

## 2022-01-14 MED ORDER — HALOPERIDOL LACTATE 5 MG/ML IJ SOLN
2.0000 mg | Freq: Four times a day (QID) | INTRAMUSCULAR | Status: AC | PRN
  Administered 2022-01-14 – 2022-01-15 (×2): 2 mg via INTRAVENOUS
  Filled 2022-01-14 (×2): qty 1

## 2022-01-14 NOTE — Progress Notes (Signed)
PT Cancellation Note  Patient Details Name: Diana Horton MRN: [id number] DOB: 1940/08/16   Cancelled Treatment:    Reason Eval/Treat Not Completed: Patient's level of consciousness Not able to arouse.  Blanchard Kelch PT Acute Rehabilitation Services Office [phone] Weekend pager-[phone]   Rada Hay 01/14/2022, 10:38 AM

## 2022-01-14 NOTE — Progress Notes (Signed)
Nutrition Follow-up  DOCUMENTATION CODES:   Severe malnutrition in context of chronic illness, Underweight  INTERVENTION:   -Ensure Plus High Protein po BID, each supplement provides 350 kcal and 20 grams of protein.   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  GOAL:   Patient will meet greater than or equal to 90% of their needs  MONITOR:   PO intake, Supplement acceptance, Labs, Weight trends, I & O's  REASON FOR ASSESSMENT:   Consult Assessment of nutrition requirement/status  ASSESSMENT:   81 y.o. female with PMH significant of dementia osteoporosis, B12 deficiency presenting to the hospital with a fall.  12/18: s/p thoracentesis, yield: 150 ml bloody fluid  Patient sleeping in room, no family present at bedside. Pt has Alzheimer's dementia, unable to provide any history. Per RN, pt was agitated earlier this morning.  Breakfast tray sitting in room, untouched. Last recorded intake was 75% of lunch 12/19.  Have ordered Ensure supplements and Magic cups on lunch/dinner trays.   Per weight records, no weight loss noted.  Medications reviewed.  Labs reviewed.  NUTRITION - FOCUSED PHYSICAL EXAM:  Flowsheet Row Most Recent Value  Orbital Region Severe depletion  Upper Arm Region Severe depletion  Thoracic and Lumbar Region Severe depletion  Buccal Region Severe depletion  Temple Region Severe depletion  Clavicle Bone Region Severe depletion  Clavicle and Acromion Bone Region Severe depletion  Scapular Bone Region Severe depletion  Dorsal Hand Severe depletion  Patellar Region Severe depletion  Anterior Thigh Region Severe depletion  Posterior Calf Region Severe depletion  Edema (RD Assessment) None  Hair Reviewed  Eyes Unable to assess  Mouth Reviewed  Skin  Reviewed  Nails Reviewed       Diet Order:   Diet Order             DIET DYS 3 Room service appropriate? Yes; Fluid consistency: Thin  Diet effective now                   EDUCATION NEEDS:   Not appropriate for education at this time  Skin:  Skin Assessment: Reviewed RN Assessment  Last BM:  PTA  Height:   Ht Readings from Last 1 Encounters:  01/13/22 5' 7.5" (1.715 m)    Weight:   Wt Readings from Last 1 Encounters:  01/13/22 45 kg    BMI:  Body mass index is 15.31 kg/m.  Estimated Nutritional Needs:   Kcal:  1650-1850  Protein:  75-90g  Fluid:  1.8L/day    Tilda Franco, MS, RD, LDN Inpatient Clinical Dietitian Contact information available via Amion

## 2022-01-14 NOTE — TOC Initial Note (Addendum)
Transition of Care [facility]) - Initial/Assessment Note    Patient Details  Name: Diana Horton MRN: [id number] Date of Birth: 02/16/1940  [facility] CM/SW Contact:    Adrian Prows, RN Phone Number: 01/14/2022, 12:08 PM  Clinical Narrative:                 [facility] consult for placement; Pt from [facility]; verified level of care w/ Lester Kinsman, Med Aide at facility; she says the pt can feed herself but she needs assistance w/ dressing and toileting; she says the pt uses a walker but is unsteady on her feet; attempted to contact pt's son [name] [phone]); to discuss d/c plans; LVM; awaiting return call; PT unable to complete eval today; awaiting result of PT eval;TOC will follow.   -1216- pt's son [name] called back; he says he is not sure of d/c plans; he says there is no one at home to take care of pt; he also says they are paying for the pt to stay at [facility]; he also says that he wonders if this facility is the right place for her due to multiple falls; her son says [name] will pay for 24/7 care but not memory care; he also says [employer] told him get pre-authorization for facilities; informed pt's son awaiting PT; he verbalized understanding.  Planned Disposition: Skilled Nursing Facility     Patient Goals and CMS Choice            Expected Discharge Plan and Services Planned Disposition: Skilled Nursing Facility                                              Prior Living Arrangements/Services                       Activities of Daily Living Home Assistive Devices/Equipment: Grab bars around toilet, Grab bars in shower, Walker (specify type) (2 wheel) ADL Screening (condition at time of admission) Patient's cognitive ability adequate to safely complete daily activities?: No Is the patient deaf or have difficulty hearing?: Yes Does the patient have difficulty seeing, even when wearing glasses/contacts?:  No Does the patient have difficulty concentrating, remembering, or making decisions?: Yes Patient able to express need for assistance with ADLs?: Yes Does the patient have difficulty dressing or bathing?: Yes Independently performs ADLs?: No Communication: Independent Dressing (OT): Needs assistance Is this a change from baseline?: Pre-admission baseline Grooming: Needs assistance Is this a change from baseline?: Pre-admission baseline Feeding: Independent Bathing: Independent Toileting: Needs assistance Is this a change from baseline?: Pre-admission baseline In/Out Bed: Independent Walks in Home: Independent with device (comment) (2 wheel walker) Does the patient have difficulty walking or climbing stairs?: Yes Weakness of Legs: Both Weakness of Arms/Hands: None  Permission Sought/Granted                  Emotional Assessment              Admission diagnosis:  Hemothorax, right [J94.2] Fall, initial encounter [W19.XXXA] Closed fracture of multiple ribs of right side, initial encounter [S22.41XA] Hemothorax on right [J94.2] Patient Active Problem List   Diagnosis Date Noted   Protein-calorie malnutrition, severe 01/14/2022   Hemothorax on right 01/12/2022   Hemothorax, right 01/11/2022   Thyroid nodule 08/04/2021   Fall 07/17/2021   CKD (  chronic kidney disease), stage III (HCC) 04/29/2021   Closed fracture of hip (HCC) 04/28/2021   Moderate late onset Alzheimer's dementia (HCC) 12/09/2020   Folic acid deficiency 02/08/2019   Vitamin B12 deficiency 02/08/2019   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/02/2019   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   Hearing loss 08/13/2017   Aortic atherosclerosis (HCC) 05/03/2017   Vision loss, right eye 03/30/2017   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   Weight loss 02/17/2017   Breast cyst, left 02/02/2017    Increased frequency of urination 08/18/2016   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   Primary osteoarthritis of right knee 10/08/2015   Tremor 09/02/2015   Osteoporosis 06/17/2015   Seasonal allergic rhinitis due to pollen 05/02/2015   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 [place] [address] Phone: [phone] Fax: [phone]     Social Determinants of Health (SDOH) Social History: SDOH Screenings   Tobacco Use: Unknown (01/13/2022)   SDOH Interventions:     Readmission Risk Interventions     No data to display

## 2022-01-14 NOTE — Progress Notes (Addendum)
PROGRESS NOTE    Diana Horton  [id number] DOB: 12-01-1940 DOA: 01/11/2022 PCP: Pcp, No   Brief Narrative:  81 year old female with history of dementia, osteoporosis, B12 deficiency admitted for fall from [facility] memory care unit.  Her fall was unwitnessed and unfortunately sustained another fall while in the hospital on 12/8.  She was found to have multiple acute right-sided rib fracture with hemothorax.  EDP discussed case with trauma surgery who recommended conservative management at this time and did not require any chest tube.   Assessment & Plan:  Principal Problem:   Hemothorax, right Active Problems:   Hemothorax on right   Multiple acute right rib fracture. Subacute/chronic right-sided hemothorax-traumatic History of osteoporosis  These are pathologic fracture, underwent thoracentesis.  Appears to have complex fibrinous exudate.  EDP consulted with trauma surgery who did not think patient will require chest tube.  Currently conservative measures.  Pain control.    History of recurrent fall. X-ray hip, CT head, CT chest, CT C-spine and CT abdomen and pelvis were performed.  Only showing acute rib fracture, stable T7 fracture.  No other obvious evidence of acute trauma.   History of severe dementia. Off-and-on agitation.  She is at risk of getting out of bed and falling.  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 Recommendation is for semiannual imaging. Monitor.   Goals of care conversation. Discussed with patient's son on the phone with regards to her goals of care. Patient's son was able to pull out her living will which she does mention DNR. And situation of the patient has a cardiac arrest son would like the patient to remain comfortable. Monitor for now.   Given that there is a risk for hemothorax currently holding off on chemical DVT prophylaxis   Adult failure to  thrive. Encourage oral intake.  Dietitian consulted  DVT prophylaxis: SCDs Start: 01/11/22 1816 Place TED hose Start: 01/11/22 1816  Code Status: DNR Family Communication:  Cordelia Pen- updated  Status is: Inpatient Remains inpatient appropriate because: Currently agitated requiring medication to help this.     Subjective: Patient seen and examined at bedside, she is quite drowsy as she received multiple medications overnight due to agitation.  No other complaints this morning.   Examination:  General exam: Does not appear to be in acute distress, cachectic frail Respiratory system: Clear to auscultation. Respiratory effort normal. Cardiovascular system: S1 & S2 heard, RRR. No JVD, murmurs, rubs, gallops or clicks. No pedal edema. Gastrointestinal system: Abdomen is nondistended, soft and nontender. No organomegaly or masses felt. Normal bowel sounds heard. Central nervous system: Unable to assess but grossly moving all the extremities Extremities: No obvious injury noted Skin: No rashes, lesions or ulcers Psychiatry: Unable to assess    Objective: Vitals:   01/13/22 1503 01/13/22 1851 01/13/22 1932 01/14/22 0308  BP: (!) 130/91  (!) 148/96 (!) 143/98  Pulse: 84  94 80  Resp: 14  18 18   Temp: 98 F (36.7 C)  98.2 F (36.8 C) (!) 97.4 F (36.3 C)  TempSrc: Oral  Oral   SpO2: 90%  95% 98%  Weight:  45 kg    Height:  5' 7.5" (1.715 m)      Intake/Output Summary (Last 24 hours) at 01/14/2022 0853 Last data filed at 01/14/2022 0100 Gross per 24 hour  Intake 1731.53 ml  Output --  Net 1731.53 ml   [employer]  01/13/22 1851  Weight: 45 kg     Data Reviewed:   CBC: Recent Labs  Lab 01/11/22 1336 01/12/22 0759 01/13/22 0815  WBC 11.0* 9.2 7.7  NEUTROABS 8.9*  --   --   HGB 9.0* 9.4* 9.3*  HCT 28.8* 30.5* 30.5*  MCV 90.0 90.5 90.5  PLT 361 406* [id number]   Basic Metabolic Panel: Recent Labs  Lab 01/11/22 1336 01/12/22 0759 01/13/22 0815  NA 139  140 139  K 3.8 3.4* 3.6  CL 104 106 106  CO2 24 23 26   GLUCOSE 96 83 101*  BUN 38* 27* 24*  CREATININE 1.01* 0.92 0.81  CALCIUM 9.2 9.0 8.3*   GFR: Estimated Creatinine Clearance: 39.4 mL/min (by C-G formula based on SCr of 0.81 mg/dL). Liver Function Tests: Recent Labs  Lab 01/12/22 0759  AST 20  ALT 17  ALKPHOS 83  BILITOT 0.9  PROT 6.6  ALBUMIN 3.6   No results for input(s): "LIPASE", "AMYLASE" in the last 168 hours. No results for input(s): "AMMONIA" in the last 168 hours. Coagulation Profile: No results for input(s): "INR", "PROTIME" in the last 168 hours. Cardiac Enzymes: No results for input(s): "CKTOTAL", "CKMB", "CKMBINDEX", "TROPONINI" in the last 168 hours. BNP (last 3 results) No results for input(s): "PROBNP" in the last 8760 hours. HbA1C: No results for input(s): "HGBA1C" in the last 72 hours. CBG: No results for input(s): "GLUCAP" in the last 168 hours. Lipid Profile: No results for input(s): "CHOL", "HDL", "LDLCALC", "TRIG", "CHOLHDL", "LDLDIRECT" in the last 72 hours. Thyroid Function Tests: No results for input(s): "TSH", "T4TOTAL", "FREET4", "T3FREE", "THYROIDAB" in the last 72 hours. Anemia Panel: No results for input(s): "VITAMINB12", "FOLATE", "FERRITIN", "TIBC", "IRON", "RETICCTPCT" in the last 72 hours. Sepsis Labs: Recent Labs  Lab 01/11/22 1336  LATICACIDVEN 0.9    Recent Results (from the past 240 hour(s))  Culture, blood (routine x 2)     Status: None (Preliminary result)   Collection Time: 01/11/22  1:36 PM   Specimen: BLOOD  Result Value Ref Range Status   Specimen Description   Final    BLOOD LEFT ANTECUBITAL Performed at [facility] [address], [place], [place] [address]    Special Requests   Final    BOTTLES DRAWN AEROBIC AND ANAEROBIC Blood Culture results may not be optimal due to an inadequate volume of blood received in culture bottles Performed at [facility] [address], [place], [place] [address]    Culture   Final    NO GROWTH 3 DAYS Performed at [facility], [place] [address], [place],  [address]    Report Status PENDING  Incomplete  Resp panel by RT-PCR (RSV, Flu A&B, Covid) Anterior Nasal Swab     Status: None   Collection Time: 01/11/22  1:36 PM   Specimen: Anterior Nasal Swab  Result Value Ref Range Status   SARS Coronavirus 2 by RT PCR NEGATIVE NEGATIVE Final    Comment: (NOTE) SARS-CoV-2 target nucleic acids are NOT DETECTED.  The SARS-CoV-2 RNA is generally detectable in upper respiratory specimens during the acute phase of infection. The lowest concentration of SARS-CoV-2 viral copies this assay can detect is 138 copies/mL. A negative result does not preclude SARS-Cov-2 infection and should not be used as the sole basis for treatment or other patient management decisions. A negative result may occur with  improper specimen collection/handling, submission of specimen other than nasopharyngeal swab, presence of viral mutation(s) within the areas targeted  by this assay, and inadequate number of viral copies(<138 copies/mL). A negative result must be combined with clinical observations, patient history, and epidemiological information. The expected result is Negative.  Fact Sheet for Patients:  BloggerCourse.com  Fact Sheet for Healthcare Providers:  SeriousBroker.it  This test is no t yet approved or cleared by the [place] FDA and  has been authorized for detection and/or diagnosis of SARS-CoV-2 by FDA under an Emergency Use Authorization (EUA). This EUA will remain  in effect (meaning this test can be used) for the duration of the COVID-19 declaration under Section 564(b)(1) of the Act, 21 U.S.C.section 360bbb-3(b)(1), unless the authorization is terminated  or revoked sooner.       Influenza A by PCR NEGATIVE NEGATIVE Final   Influenza B by PCR NEGATIVE NEGATIVE  Final    Comment: (NOTE) The Xpert Xpress SARS-CoV-2/FLU/RSV plus assay is intended as an aid in the diagnosis of influenza from Nasopharyngeal swab specimens and should not be used as a sole basis for treatment. Nasal washings and aspirates are unacceptable for Xpert Xpress SARS-CoV-2/FLU/RSV testing.  Fact Sheet for Patients: BloggerCourse.com  Fact Sheet for Healthcare Providers: SeriousBroker.it  This test is not yet approved or cleared by the [place] FDA and has been authorized for detection and/or diagnosis of SARS-CoV-2 by FDA under an Emergency Use Authorization (EUA). This EUA will remain in effect (meaning this test can be used) for the duration of the COVID-19 declaration under Section 564(b)(1) of the Act, 21 U.S.C. section 360bbb-3(b)(1), unless the authorization is terminated or revoked.     Resp Syncytial Virus by PCR NEGATIVE NEGATIVE Final    Comment: (NOTE) Fact Sheet for Patients: BloggerCourse.com  Fact Sheet for Healthcare Providers: SeriousBroker.it  This test is not yet approved or cleared by the [place] FDA and has been authorized for detection and/or diagnosis of SARS-CoV-2 by FDA under an Emergency Use Authorization (EUA). This EUA will remain in effect (meaning this test can be used) for the duration of the COVID-19 declaration under Section 564(b)(1) of the Act, 21 U.S.C. section 360bbb-3(b)(1), unless the authorization is terminated or revoked.  Performed at [facility], [address] [place], [place] [address]   Culture, blood (routine x 2)     Status: None (Preliminary result)   Collection Time: 01/11/22  1:41 PM   Specimen: BLOOD  Result Value Ref Range Status   Specimen Description   Final    BLOOD LEFT ANTECUBITAL Performed at [facility], [address], [place], [place] [address]     Special Requests   Final    BOTTLES DRAWN AEROBIC AND ANAEROBIC Blood Culture adequate volume Performed at [facility], [address], [place], [place] [address]    Culture   Final    NO GROWTH 3 DAYS Performed at [facility], [address] [place], [place] [address]    Report Status PENDING  Incomplete         Radiology Studies: DG Lumbar Spine 1 View  Result Date: 01/12/2022 CLINICAL DATA:  Lower back pain. 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 Severe left L1-2 and L2-3, moderate to severe left and moderate right L3-4, severe right L4-5, and severe diffuse L5-S1 disc space narrowing. Moderate T11-12 and T12-L1 disc space narrowing. Multilevel endplate sclerosis and peripheral osteophytes. Partial visualization of total left hip arthroplasty hardware and minimal visualization of right  proximal femoral cephalomedullary nail. IMPRESSION: 1. 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 2. Severe multilevel degenerative disc and endplate changes. Electronically Signed   By: Yvonne Kendall M.D.   On: 01/12/2022 18:30   DG CHEST PORT 1 VIEW  Result Date: 01/12/2022 CLINICAL DATA:  Shortness of breath.  Lower back pain. EXAM: PORTABLE CHEST 1 VIEW COMPARISON:  AP chest 06/12/2021 at 10:34 a.m., CT chest, abdomen, and pelvis 01/11/2022, AP chest 01/11/2022, chest two views 12/11/2021 FINDINGS: Redemonstration of homogeneous opacification of the inferior 2/3 of the right hemithorax, the large right pleural effusion as seen on prior radiographs and CT. Multiple displaced posterior right mid to lower rib fractures are again seen. No pneumothorax. Patchy left basilar lung opacities are similar to prior. Cardiac silhouette and mediastinal contours are unchanged with mild enlargement of the cardiac silhouette. Mild-to-moderate calcification within the aortic arch.  IMPRESSION: 1. Redemonstration of large right pleural effusion and multiple displaced posterior right mid to lower rib fractures. 2. Unchanged left basilar lung opacities. Electronically Signed   By: Yvonne Kendall M.D.   On: 01/12/2022 18:29   DG HIP UNILAT WITH PELVIS 2-3 VIEWS LEFT  Result Date: 01/12/2022 CLINICAL DATA:  Lower back and left hip pain after fall. EXAM: DG HIP (WITH OR WITHOUT PELVIS) 2-3V LEFT COMPARISON:  Pelvis and left hip radiographs 01/11/2022, CT abdomen and pelvis 01/11/2022 FINDINGS: There is diffuse decreased bone mineralization. Postsurgical changes are again seen of right cephalomedullary nail fixation of a subacute to chronic distal right femoral neck fracture. Postsurgical changes are seen of total left hip arthroplasty. No perihardware lucency is seen to indicate hardware failure or loosening. Mild bilateral sacroiliac subchondral sclerosis. Moderate pubic symphysis joint space narrowing and mild peripheral degenerative osteophytes. Severe right L4-5 disc space narrowing and peripheral endplate osteophytes. Mild vascular phleboliths overlie the pelvis. IMPRESSION: Postsurgical changes of right cephalomedullary nail fixation of a subacute to chronic distal right femoral neck fracture and total left hip arthroplasty. No acute fracture or evidence of hardware failure. Electronically Signed   By: Yvonne Kendall M.D.   On: 01/12/2022 18:24   US THORACENTESIS ASP PLEURAL SPACE W/IMG GUIDE  Result Date: 01/12/2022 INDICATION: Hemothorax, trauma EXAM: ULTRASOUND GUIDED RIGHT THORACENTESIS MEDICATIONS: None. COMPLICATIONS: None immediate. PROCEDURE: An ultrasound guided thoracentesis was thoroughly discussed with the patient and patient's son and questions answered. The benefits, risks, alternatives and complications were also discussed. The patient understands and wishes to proceed with the procedure. Written consent was obtained. Ultrasound was performed to localize and mark an  adequate pocket of fluid in the right chest. The area was then prepped and draped in the normal sterile fashion. 1% Lidocaine was used for local anesthesia. Under ultrasound guidance a 6 Fr Safe-T-Centesis catheter was introduced. Thoracentesis was performed. The catheter was removed and a dressing applied. FINDINGS: A total of approximately 150cc of bloody fluid was removed. Samples were sent to the laboratory as requested by the clinical team. IMPRESSION: Successful ultrasound guided right thoracentesis yielding 150cc of pleural fluid. Performed and dictated by Pasty Spillers, PA-C Electronically Signed   By: Corrie Mckusick D.O.   On: 01/12/2022 11:01   DG CHEST PORT 1 VIEW  Result Date: 01/12/2022 CLINICAL DATA:  Status post thoracentesis. EXAM: PORTABLE CHEST 1 VIEW COMPARISON:  Previous exams including RIGHT chest ultrasound from earlier same day and most recent chest x-ray dated 01/11/2022. FINDINGS: RIGHT pleural effusion, not significantly changed in appearance on today's chest x-ray. Associated perihilar edema. Stable patchy opacities at the LEFT  lung base, likely atelectasis. No pneumothorax is seen. Heart size and mediastinal contours appear stable. IMPRESSION: 1. RIGHT pleural effusion, not significantly changed in appearance on today's chest x-ray. However, ultrasound-guided thoracentesis images from earlier same day showed removal of 120 cc. 2. Associated RIGHT perihilar edema. 3. No pneumothorax seen. Electronically Signed   By: Franki Cabot M.D.   On: 01/12/2022 10:45        Scheduled Meds:  feeding supplement  237 mL Oral BID BM   memantine  10 mg Oral BID   traZODone  50 mg Oral QHS   Continuous Infusions:  sodium chloride 50 mL/hr at 01/14/22 0100     LOS: 2 days   Time spent= 35 mins    Laelynn Blizzard Arsenio Loader, MD [facility]  If 7PM-7AM, please contact night-coverage  01/14/2022, 8:53 AM

## 2022-01-15 DIAGNOSIS — J942 Hemothorax: Secondary | ICD-10-CM | POA: Diagnosis not present

## 2022-01-15 LAB — BASIC METABOLIC PANEL
Anion gap: 7 (ref 5–15)
BUN: 19 mg/dL (ref 8–23)
CO2: 26 mmol/L (ref 22–32)
Calcium: 8.4 mg/dL — ABNORMAL LOW (ref 8.9–10.3)
Chloride: 102 mmol/L (ref 98–111)
Creatinine, Ser: 0.84 mg/dL (ref 0.44–1.00)
GFR, Estimated: >60 mL/min (ref >60)
Glucose, Bld: 106 mg/dL — ABNORMAL HIGH (ref 70–99)
Potassium: 3.7 mmol/L (ref 3.5–5.1)
Sodium: 135 mmol/L (ref 135–145)

## 2022-01-15 LAB — CBC
HCT: 30.1 % — ABNORMAL LOW (ref 36.0–46.0)
Hemoglobin: 9.2 g/dL — ABNORMAL LOW (ref 12.0–15.0)
MCH: 27.7 pg (ref 26.0–34.0)
MCHC: 30.6 g/dL (ref 30.0–36.0)
MCV: 90.7 fL (ref 80.0–100.0)
Platelets: 357 10*3/uL (ref 150–400)
RBC: 3.32 MIL/uL — ABNORMAL LOW (ref 3.87–5.11)
RDW: 16.6 % — ABNORMAL HIGH (ref 11.5–15.5)
WBC: 9.6 10*3/uL (ref 4.0–10.5)
nRBC: 0 % (ref 0.0–0.2)

## 2022-01-15 LAB — TOTAL BILIRUBIN, BODY FLUID: Total bilirubin, fluid: 4.8 mg/dL

## 2022-01-15 LAB — MAGNESIUM: Magnesium: 2.1 mg/dL (ref 1.7–2.4)

## 2022-01-15 MED ORDER — OLANZAPINE 10 MG IM SOLR
5.0000 mg | Freq: Four times a day (QID) | INTRAMUSCULAR | Status: DC | PRN
  Administered 2022-01-15 – 2022-01-24 (×6): 5 mg via INTRAMUSCULAR
  Filled 2022-01-15 (×12): qty 10

## 2022-01-15 MED ORDER — STERILE WATER FOR INJECTION IJ SOLN
INTRAMUSCULAR | Status: AC
  Filled 2022-01-15: qty 10

## 2022-01-15 MED ORDER — RISPERIDONE 0.25 MG PO TABS
0.5000 mg | ORAL_TABLET | Freq: Every day | ORAL | Status: DC
  Administered 2022-01-15: 0.5 mg via ORAL
  Filled 2022-01-15: qty 2

## 2022-01-15 MED ORDER — HALOPERIDOL LACTATE 5 MG/ML IJ SOLN
1.0000 mg | Freq: Four times a day (QID) | INTRAMUSCULAR | Status: AC | PRN
  Administered 2022-01-15 – 2022-01-17 (×3): 1 mg via INTRAVENOUS
  Filled 2022-01-15 (×3): qty 1

## 2022-01-15 MED ORDER — ZIPRASIDONE MESYLATE 20 MG IM SOLR
5.0000 mg | Freq: Once | INTRAMUSCULAR | Status: AC
  Administered 2022-01-15: 5 mg via INTRAMUSCULAR
  Filled 2022-01-15: qty 20

## 2022-01-15 NOTE — Progress Notes (Signed)
PROGRESS NOTE    GENIE WENKE  ZGY:[id number] DOB: 1940-06-04 DOA: 01/11/2022 PCP: Pcp, No   Brief Narrative:  81 year old female with history of dementia, osteoporosis, B12 deficiency admitted for fall from [facility] memory care unit.  Her fall was unwitnessed and unfortunately sustained another fall while in the hospital on 12/8.  She was found to have multiple acute right-sided rib fracture with hemothorax.  EDP discussed case with trauma surgery who recommended conservative management at this time and did not require any chest tube.  Off-and-on during hospitalization patient has had increasing agitation requiring Haldol, Zyprexa and Xanax.   Assessment & Plan:  Principal Problem:   Hemothorax, right Active Problems:   Hemothorax on right   Protein-calorie malnutrition, severe   Multiple acute right rib fracture. Subacute/chronic right-sided hemothorax-traumatic History of osteoporosis  These are pathologic fracture, underwent thoracentesis.  Appears to have complex fibrinous exudate.  EDP consulted with trauma surgery who did not think patient will require chest tube.  Currently conservative measures.  Pain control.    History of recurrent fall. X-ray hip, CT head, CT chest, CT C-spine and CT abdomen and pelvis were performed.  Only showing acute rib fracture, stable T7 fracture.  No other obvious evidence of acute trauma.   History of severe dementia. Intermittent agitation Off-and-on agitation.  She is at risk of getting out of bed and falling.  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 Recommendation is for semiannual imaging. Monitor.   Goals of care conversation. Son confirms DNR   Given that there is a risk for hemothorax currently holding off on chemical DVT prophylaxis    Adult failure to thrive. Encourage oral intake.  Dietitian consulted  DVT prophylaxis: SCDs Start: 01/11/22 1816 Place TED hose Start: 01/11/22 1816  Code Status: DNR Family Communication:  Varney Biles- updated  Status is: Inpatient Remains inpatient appropriate because: Currently agitated requiring medication to help this. Finding safe disposition    Subjective: Seen and examined at bedside, overnight had some agitation requiring Haldol.  This morning she is trying to get out of bed.   Examination: Constitutional: Cachectic frail.  Does not appear to be in any distress.  Appears little agitated Respiratory: Clear to auscultation bilaterally Cardiovascular: Normal sinus rhythm, no rubs Abdomen: Nontender nondistended good bowel sounds Musculoskeletal: No edema noted Skin: No rashes seen Neurologic: CN 2-12 grossly intact.  And nonfocal Psychiatric: She is agitated alert to name only  Objective: Vitals:   01/13/22 1932 01/14/22 0308 01/14/22 2041 01/15/22 0413  BP: (!) 148/96 (!) 143/98 (!) 135/91 (!) 142/94  Pulse: 94 80 (!) 101 89  Resp: 18 18 18 18   Temp: 98.2 F (36.8 C) (!) 97.4 F (36.3 C) 98 F (36.7 C) 97.7 F (36.5 C)  TempSrc: Oral  Oral Oral  SpO2: 95% 98% 95% 90%  Weight:      Height:       No intake or output data in the 24 hours ending 01/15/22 0849  Filed Weights   01/13/22 1851  Weight: 45 kg     Data Reviewed:   CBC: Recent Labs  Lab 01/11/22 1336 01/12/22 0759 01/13/22 0815 01/14/22 0842 01/15/22 0735  WBC 11.0* 9.2 7.7 7.5 9.6  NEUTROABS 8.9*  --   --   --   --  HGB 9.0* 9.4* 9.3* 9.5* 9.2*  HCT 28.8* 30.5* 30.5* 31.5* 30.1*  MCV 90.0 90.5 90.5 91.3 90.7  PLT 361 406* 377 366 357   Basic Metabolic Panel: Recent Labs  Lab 01/11/22 1336 01/12/22 0759 01/13/22 0815 01/14/22 0842 01/15/22 0735  NA 139 140 139 140 135  K 3.8 3.4* 3.6 3.8 3.7  CL 104 106 106 108 102  CO2 24 23 26 28 26   GLUCOSE 96 83 101* 108* 106*   BUN 38* 27* 24* 19 19  CREATININE 1.01* 0.92 0.81 0.87 0.84  CALCIUM 9.2 9.0 8.3* 8.5* 8.4*  MG  --   --   --   --  2.1   GFR: Estimated Creatinine Clearance: 37.9 mL/min (by C-G formula based on SCr of 0.84 mg/dL). Liver Function Tests: Recent Labs  Lab 01/12/22 0759  AST 20  ALT 17  ALKPHOS 83  BILITOT 0.9  PROT 6.6  ALBUMIN 3.6   No results for input(s): "LIPASE", "AMYLASE" in the last 168 hours. No results for input(s): "AMMONIA" in the last 168 hours. Coagulation Profile: No results for input(s): "INR", "PROTIME" in the last 168 hours. Cardiac Enzymes: No results for input(s): "CKTOTAL", "CKMB", "CKMBINDEX", "TROPONINI" in the last 168 hours. BNP (last 3 results) No results for input(s): "PROBNP" in the last 8760 hours. HbA1C: No results for input(s): "HGBA1C" in the last 72 hours. CBG: No results for input(s): "GLUCAP" in the last 168 hours. Lipid Profile: No results for input(s): "CHOL", "HDL", "LDLCALC", "TRIG", "CHOLHDL", "LDLDIRECT" in the last 72 hours. Thyroid Function Tests: Recent Labs    01/14/22 0942  TSH 11.199*  FREET4 0.85   Anemia Panel: Recent Labs    01/14/22 0942  VITAMINB12 1,501*   Sepsis Labs: Recent Labs  Lab 01/11/22 1336  LATICACIDVEN 0.9    Recent Results (from the past 240 hour(s))  Culture, blood (routine x 2)     Status: None (Preliminary result)   Collection Time: 01/11/22  1:36 PM   Specimen: BLOOD  Result Value Ref Range Status   Specimen Description   Final    BLOOD LEFT ANTECUBITAL Performed at [facility], [address], [place], [place]    Special Requests   Final    BOTTLES DRAWN AEROBIC AND ANAEROBIC Blood Culture results may not be optimal due to an inadequate volume of blood received in culture bottles Performed at [facility], [address], [place], [place]    Culture   Final    NO GROWTH 4 DAYS Performed at [facility], [address], [place], [place]    Report Status PENDING  Incomplete  Resp panel by RT-PCR (RSV, Flu A&B, Covid) Anterior Nasal Swab     Status: None   Collection Time: 01/11/22  1:36 PM   Specimen: Anterior Nasal Swab  Result Value Ref Range Status   SARS Coronavirus 2 by RT PCR NEGATIVE NEGATIVE Final    Comment: (NOTE) SARS-CoV-2 target nucleic acids are NOT DETECTED.  The SARS-CoV-2 RNA is generally detectable in upper respiratory specimens during the acute phase of infection. The lowest concentration of SARS-CoV-2 viral copies this assay can detect is 138 copies/mL. A negative result does not preclude SARS-Cov-2 infection and should not be used as the sole basis for treatment or other patient management decisions. A negative result may occur with  improper specimen collection/handling, submission of specimen other than nasopharyngeal swab, presence of viral mutation(s) within the areas  targeted by this assay, and inadequate number of viral copies(<138 copies/mL). A negative result must be combined with clinical observations, patient history, and epidemiological information. The expected result is Negative.  Fact Sheet for Patients:  BloggerCourse.com  Fact Sheet for Healthcare Providers:  SeriousBroker.it  This test is no t yet approved or cleared by the [place] FDA and  has been authorized for detection and/or diagnosis of SARS-CoV-2 by FDA under an Emergency Use Authorization (EUA). This EUA will remain  in effect (meaning this test can be used) for the duration of the COVID-19 declaration under Section 564(b)(1) of the Act, 21 U.S.C.section 360bbb-3(b)(1), unless the authorization is terminated  or revoked sooner.       Influenza A by PCR NEGATIVE NEGATIVE Final   Influenza B by PCR NEGATIVE NEGATIVE Final    Comment: (NOTE) The Xpert Xpress SARS-CoV-2/FLU/RSV plus assay is intended as an aid in the diagnosis of  influenza from Nasopharyngeal swab specimens and should not be used as a sole basis for treatment. Nasal washings and aspirates are unacceptable for Xpert Xpress SARS-CoV-2/FLU/RSV testing.  Fact Sheet for Patients: BloggerCourse.com  Fact Sheet for Healthcare Providers: SeriousBroker.it  This test is not yet approved or cleared by the [place] FDA and has been authorized for detection and/or diagnosis of SARS-CoV-2 by FDA under an Emergency Use Authorization (EUA). This EUA will remain in effect (meaning this test can be used) for the duration of the COVID-19 declaration under Section 564(b)(1) of the Act, 21 U.S.C. section 360bbb-3(b)(1), unless the authorization is terminated or revoked.     Resp Syncytial Virus by PCR NEGATIVE NEGATIVE Final    Comment: (NOTE) Fact Sheet for Patients: BloggerCourse.com  Fact Sheet for Healthcare Providers: SeriousBroker.it  This test is not yet approved or cleared by the [place] FDA and has been authorized for detection and/or diagnosis of SARS-CoV-2 by FDA under an Emergency Use Authorization (EUA). This EUA will remain in effect (meaning this test can be used) for the duration of the COVID-19 declaration under Section 564(b)(1) of the Act, 21 U.S.C. section 360bbb-3(b)(1), unless the authorization is terminated or revoked.  Performed at [facility], [address], [place], [place] [address]   Culture, blood (routine x 2)     Status: None (Preliminary result)   Collection Time: 01/11/22  1:41 PM   Specimen: BLOOD  Result Value Ref Range Status   Specimen Description   Final    BLOOD LEFT ANTECUBITAL Performed at [facility], [address] [place], [place] [address]    Special Requests   Final    BOTTLES DRAWN AEROBIC AND ANAEROBIC Blood Culture adequate volume Performed at [facility], [address] [place], [place] [address]    Culture   Final    NO GROWTH 4 DAYS Performed at [facility], [address] [place], [place] [address]    Report Status PENDING  Incomplete         Radiology Studies: No results found.      Scheduled Meds:  feeding supplement  237 mL Oral BID BM   melatonin  6 mg Oral QHS   memantine  10 mg Oral BID   sertraline  200 mg Oral q AM   traZODone  50 mg Oral QHS   Continuous Infusions:  sodium chloride 50 mL/hr at 01/14/22 1240     LOS: 3 days   Time spent= 35 mins    Tymara Saur Chirag Ragan Reale,  MD [facility]  If 7PM-7AM, please contact night-coverage  01/15/2022, 8:49 AM

## 2022-01-15 NOTE — Evaluation (Signed)
Physical Therapy Evaluation Patient Details Name: Diana Horton MRN: [id number] DOB: [birth year] Today's Date: 01/15/2022  History of Present Illness  Patient is a 81 year old female who presented after a fall at memory care unit. Patient developed a L side hematoma and sent to the hospital. 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 Patient underwent thoracentesis yielding 12500ccs on 12/18. PMH: dementia, B12 deficiency, osteoporosis.  Clinical Impression  Patient presents with decreased mobility due to pain, limited activity tolerance, decreased balance, decreased safety awareness and continued high fall risk.  She was attempting OOB upon my entry due to urinating and soiled bed.  RN assisted to get [facility] while I assisted pt then transferred with mod A +2 for safety then needing max cues and mod A for safety while on BSC due to attempting back to bed c/o back pain and RN and NT changing bed linens.  She was able to stand and pivot back to bed with mod A, but needed +2 for hygiene, gown change, and positioning in the bed.  Feel she could return to memory care unit with HHPT at facility or could go to STSNF due to decreased mobility and actually increased risk of falls.  PT will continue to follow acutely.        Recommendations for follow up therapy are one component of a multi-disciplinary discharge planning process, led by the attending physician.  Recommendations may be updated based on patient status, additional functional criteria and insurance authorization.  Follow Up Recommendations Home health PT (At ALF versus STSNF)      Assistance Recommended at Discharge Frequent or constant Supervision/Assistance  Patient can return home with the following  Two people to help with bathing/dressing/bathroom;Two people to help with walking and/or transfers;Direct supervision/assist for medications  management;Assist for transportation;Help with stairs or ramp for entrance    Equipment Recommendations None recommended by PT  Recommendations for Other Services       Functional Status Assessment Patient has had a recent decline in their functional status and demonstrates the ability to make significant improvements in function in a reasonable and predictable amount of time.     Precautions / Restrictions Precautions Precautions: Fall Precaution Comments: R side rib fractures, L side hematoma      Mobility  Bed Mobility Overal bed mobility: Needs Assistance Bed Mobility: Supine to Sit     Supine to sit: Min assist     General bed mobility comments: put pulling up on PT    Transfers Overall transfer level: Needs assistance Equipment used: None Transfers: Bed to chair/wheelchair/BSC, Sit to/from Stand Sit to Stand: Mod assist Stand pivot transfers: Mod assist, +2 safety/equipment         General transfer comment: up to stand wtih lifting help, to [facility] with RN standing by to stabilize BSC and assist to move hips to allow to sit on BSC; back to bed via squat pivot pt needing help for lifting and positioning hips    Ambulation/Gait               General Gait Details: unable  Stairs            Wheelchair Mobility    Modified Rankin (Stroke Patients Only)       Balance Overall balance assessment: Needs assistance   Sitting balance-Leahy Scale: Zero Sitting balance - Comments: max A seated on BSC to prevent anterior LOB pt leaning forward  and attempting back in bed     Standing balance-Leahy Scale: Poor Standing balance comment: mod A for balance for sit to stand back to bed                             Pertinent Vitals/Pain Pain Assessment Pain Assessment: Faces Faces Pain Scale: Hurts whole lot Pain Location: back with mobility Pain Descriptors / Indicators: Moaning, Discomfort, Grimacing, Guarding Pain Intervention(s): Monitored  during session, Limited activity within patient's tolerance, Premedicated before session    Home Living Family/patient expects to be discharged to:: Assisted living (Memory care)                 Home Equipment: None      Prior Function Prior Level of Function : Needs assist             Mobility Comments: Unsure, pt unreliable historian, per chart used RW       Hand Dominance        Extremity/Trunk Assessment   Upper Extremity Assessment Upper Extremity Assessment: Generalized weakness    Lower Extremity Assessment Lower Extremity Assessment: Generalized weakness    Cervical / Trunk Assessment Cervical / Trunk Assessment: Other exceptions Cervical / Trunk Exceptions: forward flexed entire time on BSC, able to extend spine in supine, but c/o back pain throughout (RN reports possibly with UTI)  Communication   Communication: No difficulties  Cognition Arousal/Alertness: Awake/alert Behavior During Therapy: Impulsive, Restless Overall Cognitive Status: No family/caregiver present to determine baseline cognitive functioning                                 General Comments: very poor safety awareness, climbing out of bed when I entered due to needing to toilet, attempting to get back in bed despite RN and NT changing linens on bed; needing constant redirection to wait and avoid falling forward        General Comments General comments (skin integrity, edema, etc.): soiled with urine and attempting up to toilet; RN and NT in the room to change linens while pt stabilized with constant cues on [facility]    Exercises     Assessment/Plan    PT Assessment Patient needs continued PT services  PT Problem List Decreased strength;Decreased balance;Decreased cognition;Decreased mobility;Decreased activity tolerance;Decreased safety awareness;Decreased knowledge of precautions       PT Treatment Interventions DME instruction;Functional mobility training;Balance  training;Patient/family education;Wheelchair mobility training;Therapeutic exercise;Gait training;Therapeutic activities    PT Goals (Current goals can be found in the Care Plan section)  Acute Rehab PT Goals Patient Stated Goal: unable to state PT Goal Formulation: Patient unable to participate in goal setting Time For Goal Achievement: 01/29/22 Potential to Achieve Goals: Fair    Frequency Min 2X/week     Co-evaluation               AM-PAC PT "6 Clicks" Mobility  Outcome Measure Help needed turning from your back to your side while in a flat bed without using bedrails?: A Little Help needed moving from lying on your back to sitting on the side of a flat bed without using bedrails?: A Little Help needed moving to and from a bed to a chair (including a wheelchair)?: Total Help needed standing up from a chair using your arms (e.g., wheelchair or bedside chair)?: Total Help needed to walk in hospital room?: Total Help needed climbing  3-5 steps with a railing? : Total 6 Click Score: 10    End of Session   Activity Tolerance: Patient limited by pain;Treatment limited secondary to agitation Patient left: in bed;with bed alarm set;with call bell/phone within reach   PT Visit Diagnosis: Other symptoms and signs involving the nervous system (R29.898);Other abnormalities of gait and mobility (R26.89);Repeated falls (R29.6);Pain Pain - part of body:  (back)    Time: 1445-1500 PT Time Calculation (min) (ACUTE ONLY): 15 min   Charges:   PT Evaluation $PT Eval High Complexity: 1 High          Sheran Lawless, PT Acute Rehabilitation Services [phone] 01/15/2022   Elray Mcgregor 01/15/2022, 3:45 PM

## 2022-01-15 NOTE — Progress Notes (Addendum)
       CROSS COVER NOTE  NAME: Diana Horton MRN: [id number] DOB : [birth year]    Date of Service   01/15/2022   HPI/Events of Note   2030-notified by bedside RN of patient with increased agitation.    Patient was reported to be screaming and attempting to get out of bed nonstop.  No safety sitter is available at this time.  Current RN reports that prior use of Ativan has been noted to increase patient's agitation.  Patient was previously given Haldol with some relief.    2 mg IV Haldol ordered to give now.   Interventions/ Plan   IV Haldol      Anthoney Harada, DNP, ACNPC- AG [facility] [place]

## 2022-01-16 DIAGNOSIS — J942 Hemothorax: Secondary | ICD-10-CM | POA: Diagnosis not present

## 2022-01-16 LAB — URINALYSIS, ROUTINE W REFLEX MICROSCOPIC
Bilirubin Urine: NEGATIVE
Glucose, UA: NEGATIVE mg/dL
Ketones, ur: NEGATIVE mg/dL
Nitrite: POSITIVE — AB
Protein, ur: NEGATIVE mg/dL
Specific Gravity, Urine: 1.011 (ref 1.005–1.030)
WBC, UA: >50 WBC/hpf — ABNORMAL HIGH (ref 0–5)
pH: 5 (ref 5.0–8.0)

## 2022-01-16 LAB — CBC
HCT: 31.7 % — ABNORMAL LOW (ref 36.0–46.0)
Hemoglobin: 9.6 g/dL — ABNORMAL LOW (ref 12.0–15.0)
MCH: 27.6 pg (ref 26.0–34.0)
MCHC: 30.3 g/dL (ref 30.0–36.0)
MCV: 91.1 fL (ref 80.0–100.0)
Platelets: 380 10*3/uL (ref 150–400)
RBC: 3.48 MIL/uL — ABNORMAL LOW (ref 3.87–5.11)
RDW: 16.4 % — ABNORMAL HIGH (ref 11.5–15.5)
WBC: 10.9 10*3/uL — ABNORMAL HIGH (ref 4.0–10.5)
nRBC: 0 % (ref 0.0–0.2)

## 2022-01-16 LAB — CULTURE, BLOOD (ROUTINE X 2)
Culture: NO GROWTH
Culture: NO GROWTH
Special Requests: ADEQUATE

## 2022-01-16 LAB — BASIC METABOLIC PANEL
Anion gap: 9 (ref 5–15)
BUN: 14 mg/dL (ref 8–23)
CO2: 26 mmol/L (ref 22–32)
Calcium: 8.8 mg/dL — ABNORMAL LOW (ref 8.9–10.3)
Chloride: 107 mmol/L (ref 98–111)
Creatinine, Ser: 0.82 mg/dL (ref 0.44–1.00)
GFR, Estimated: >60 mL/min (ref >60)
Glucose, Bld: 107 mg/dL — ABNORMAL HIGH (ref 70–99)
Potassium: 4 mmol/L (ref 3.5–5.1)
Sodium: 142 mmol/L (ref 135–145)

## 2022-01-16 LAB — GLUCOSE, CAPILLARY: Glucose-Capillary: 96 mg/dL (ref 70–99)

## 2022-01-16 LAB — BRAIN NATRIURETIC PEPTIDE: B Natriuretic Peptide: 519.6 pg/mL — ABNORMAL HIGH (ref 0.0–100.0)

## 2022-01-16 LAB — MAGNESIUM: Magnesium: 2.3 mg/dL (ref 1.7–2.4)

## 2022-01-16 MED ORDER — FUROSEMIDE 10 MG/ML IJ SOLN
20.0000 mg | Freq: Once | INTRAMUSCULAR | Status: AC
  Administered 2022-01-16: 20 mg via INTRAVENOUS
  Filled 2022-01-16: qty 2

## 2022-01-16 MED ORDER — RISPERIDONE 1 MG PO TABS
1.0000 mg | ORAL_TABLET | Freq: Every day | ORAL | Status: DC
  Administered 2022-01-17 (×2): 1 mg via ORAL
  Filled 2022-01-16 (×2): qty 1

## 2022-01-16 MED ORDER — IPRATROPIUM-ALBUTEROL 0.5-2.5 (3) MG/3ML IN SOLN
3.0000 mL | Freq: Two times a day (BID) | RESPIRATORY_TRACT | Status: DC
  Administered 2022-01-16 – 2022-01-19 (×6): 3 mL via RESPIRATORY_TRACT
  Filled 2022-01-16 (×6): qty 3

## 2022-01-16 NOTE — Progress Notes (Signed)
Occupational Therapy Treatment Patient Details Name: Diana Horton MRN: [id number] DOB: [birth year] Today's Date: 01/16/2022   History of present illness Patient is a 81 year old female who presented after a fall at memory care unit. Patient developed a L side hematoma and sent to the hospital. 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 Patient underwent thoracentesis yielding 12500ccs on 12/18. PMH: dementia, B12 deficiency, osteoporosis.   OT comments  Patient presented with increased confusion, distractibility, impaired attention, and impulsivity. She was noted to repeatedly call out, "please call my mother!" As such, she required increased cues for sustained attention to tasks, initiation of tasks, problem solving, and safety awareness.  She was assisted into sitting EOB on a couple instances, however she would only sit EOB for a short duration, before suddenly proceeding to return to supine. ADL instruction for self-feeding as provided at bed level, however she was too distracted and confused to actively participate in task. At current, she will need around the clock care and supervision, once discharged from the hospital.    Recommendations for follow up therapy are one component of a multi-disciplinary discharge planning process, led by the attending physician.  Recommendations may be updated based on patient status, additional functional criteria and insurance authorization.    Follow Up Recommendations  Home health OT/Return to Memory Care vs. Skilled Nursing Facility     Assistance Recommended at Discharge Frequent or constant Supervision/Assistance  Patient can return home with the following  A lot of help with bathing/dressing/bathroom;Direct supervision/assist for medications management;Direct supervision/assist for financial management   Equipment Recommendations  None  recommended by OT       Precautions / Restrictions Precautions Precautions: Fall Precaution Comments: R side rib fractures, L side hematoma Restrictions Weight Bearing Restrictions: No       Mobility Bed Mobility Overal bed mobility: Needs Assistance Bed Mobility: Supine to Sit, Sit to Supine     Supine to sit: Mod assist Sit to supine: Min assist   General bed mobility comments: Pt required max cues for attention and safety awareness, given impulsivity and confusion          ADL either performed or assessed with clinical judgement   ADL Overall ADL's : Needs assistance/impaired Eating/Feeding: Maximal assistance;Bed level Eating/Feeding Details (indicate cue type and reason): ADL instruction for self-feeding was provided, however pt presented with increased confusion and minimal initiation of tasks. She required near total assist for tasks, with cues for attention, to keep her eyes open and problem solving.             Upper Body Dressing : Maximal assistance;Bed level   Lower Body Dressing: Bed level;Total assistance                        Cognition Arousal/Alertness: Awake/alert Behavior During Therapy: Impulsive, Restless Overall Cognitive Status: No family/caregiver present to determine baseline cognitive functioning Area of Impairment: Attention, Following commands, Safety/judgement, Awareness, Problem solving      Following Commands: Follows one step commands inconsistently Safety/Judgement: Decreased awareness of safety, Decreased awareness of deficits     General Comments: very poor safety awareness              General Comments      Pertinent Vitals/ Pain       Pain Assessment Pain Assessment: No/denies pain         Frequency  Min  2X/week        Progress Toward Goals  OT Goals(current goals can now be found in the care plan section)     Acute Rehab OT Goals Patient Stated Goal: for someone to call her mother OT Goal  Formulation: With patient Time For Goal Achievement: 01/26/22 Potential to Achieve Goals: Fair  Plan Discharge plan remains appropriate       AM-PAC OT "6 Clicks" Daily Activity     Outcome Measure   Help from another person eating meals?: A Lot Help from another person taking care of personal grooming?: A Lot Help from another person toileting, which includes using toliet, bedpan, or urinal?: A Lot Help from another person bathing (including washing, rinsing, drying)?: A Lot Help from another person to put on and taking off regular upper body clothing?: A Lot Help from another person to put on and taking off regular lower body clothing?: Total 6 Click Score: 11    End of Session    OT Visit Diagnosis: Unsteadiness on feet (R26.81);Muscle weakness (generalized) (M62.81)   Activity Tolerance Other (comment) (Patient limited by confusion and distractibility)   Patient Left in bed;with call bell/phone within reach   Nurse Communication Other (comment) (Licensed conveyancer and nurse aide informed of inability to set bed alarm due to technical difficulty)        Time: 1319-1340 OT Time Calculation (min): 21 min  Charges: OT General Charges $OT Visit: 1 Visit OT Treatments $Therapeutic Activity: 8-22 mins    Reuben Likes, OTR/L 01/16/2022, 2:09 PM

## 2022-01-16 NOTE — Progress Notes (Signed)
PROGRESS NOTE    Diana Horton  NWG:[id number] DOB: [birth year] DOA: 01/11/2022 PCP: Pcp, No   Brief Narrative:  81 year old female with history of dementia, osteoporosis, B12 deficiency admitted for fall from [facility] memory care unit.  Her fall was unwitnessed and unfortunately sustained another fall while in the hospital on 12/8.  She was found to have multiple acute right-sided rib fracture with hemothorax.  EDP discussed case with trauma surgery who recommended conservative management at this time and did not require any chest tube.  Off-and-on during hospitalization patient has had increasing agitation requiring Haldol, Zyprexa and Xanax.   Assessment & Plan:  Principal Problem:   Hemothorax, right Active Problems:   Hemothorax on right   Protein-calorie malnutrition, severe   Multiple acute right rib fracture. Subacute/chronic right-sided hemothorax-traumatic History of osteoporosis  These are pathologic fracture, underwent thoracentesis.  Appears to have complex fibrinous exudate.  EDP consulted with trauma surgery who did not think patient will require chest tube.  Pain control and conservative measures    History of recurrent fall. X-ray hip, CT head, CT chest, CT C-spine and CT abdomen and pelvis were performed.  Only showing acute rib fracture, stable T7 fracture.  No other obvious evidence of acute trauma.  Abnormal breath sounds - Elevated BNP.  Will stop IV fluids, give Lasix 20 mg IV once   History of severe dementia. Intermittent agitation Off-and-on agitation.  She is at risk of getting out of bed and falling.  EKG shows normal QTc, as needed Zyprexa, also received as needed Haldol Bedtime Risperdal. 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 Recommendation is for semiannual imaging. Monitor.   Goals of care conversation. Son confirms DNR   Given that there is a  risk for hemothorax currently holding off on chemical DVT prophylaxis   Adult failure to thrive. Encourage oral intake.  Dietitian consulted  DVT prophylaxis: SCDs Start: 01/11/22 1816 Place TED hose Start: 01/11/22 1816 Code Status: DNR Family Communication:  Varney Biles  Status is: Inpatient Remains inpatient appropriate because: Currently agitated requiring medication to help this.   Subjective: Calm but confused. Hard of hearing.   Examination: Constitutional: Not in acute distress Respiratory: b/l rhonchi Cardiovascular: Normal sinus rhythm, no rubs Abdomen: Nontender nondistended good bowel sounds Musculoskeletal: No edema noted Skin: No rashes seen Neurologic: grossly moving all the ext Psychiatric: AAox0   Objective: Vitals:   01/14/22 2041 01/15/22 0413 01/15/22 2222 01/16/22 0615  BP: (!) 135/91 (!) 142/94 (!) 158/99 (!) 156/101  Pulse: (!) 101 89 84 89  Resp: 18 18 18 16   Temp: 98 F (36.7 C) 97.7 F (36.5 C) 97.8 F (36.6 C) 99.2 F (37.3 C)  TempSrc: Oral Oral Oral Oral  SpO2: 95% 90% 94% (!) 86%  Weight:      Height:        Intake/Output Summary (Last 24 hours) at 01/16/2022 0839 Last data filed at 01/16/2022 0800 Gross per 24 hour  Intake 2440.19 ml  Output 800 ml  Net 1640.19 ml    Filed Weights   01/13/22 1851  Weight: 45 kg     Data Reviewed:   CBC: Recent Labs  Lab 01/11/22 1336 01/12/22 0759 01/13/22 0815 01/14/22 0842 01/15/22 0735 01/16/22 0545  WBC 11.0* 9.2 7.7 7.5 9.6 10.9*  NEUTROABS 8.9*  --   --   --   --   --  HGB 9.0* 9.4* 9.3* 9.5* 9.2* 9.6*  HCT 28.8* 30.5* 30.5* 31.5* 30.1* 31.7*  MCV 90.0 90.5 90.5 91.3 90.7 91.1  PLT 361 406* 377 366 357 380   Basic Metabolic Panel: Recent Labs  Lab 01/12/22 0759 01/13/22 0815 01/14/22 0842 01/15/22 0735 01/16/22 0545  NA 140 139 140 135 142  K 3.4* 3.6 3.8 3.7 4.0  CL 106 106 108 102 107  CO2 23 26 28 26 26   GLUCOSE 83 101* 108* 106* 107*  BUN 27* 24* 19 19  14   CREATININE 0.92 0.81 0.87 0.84 0.82  CALCIUM 9.0 8.3* 8.5* 8.4* 8.8*  MG  --   --   --  2.1 2.3   GFR: Estimated Creatinine Clearance: 38.9 mL/min (by C-G formula based on SCr of 0.82 mg/dL). Liver Function Tests: Recent Labs  Lab 01/12/22 0759  AST 20  ALT 17  ALKPHOS 83  BILITOT 0.9  PROT 6.6  ALBUMIN 3.6   No results for input(s): "LIPASE", "AMYLASE" in the last 168 hours. No results for input(s): "AMMONIA" in the last 168 hours. Coagulation Profile: No results for input(s): "INR", "PROTIME" in the last 168 hours. Cardiac Enzymes: No results for input(s): "CKTOTAL", "CKMB", "CKMBINDEX", "TROPONINI" in the last 168 hours. BNP (last 3 results) No results for input(s): "PROBNP" in the last 8760 hours. HbA1C: No results for input(s): "HGBA1C" in the last 72 hours. CBG: No results for input(s): "GLUCAP" in the last 168 hours. Lipid Profile: No results for input(s): "CHOL", "HDL", "LDLCALC", "TRIG", "CHOLHDL", "LDLDIRECT" in the last 72 hours. Thyroid Function Tests: Recent Labs    01/14/22 0942  TSH 11.199*  FREET4 0.85   Anemia Panel: Recent Labs    01/14/22 0942  VITAMINB12 1,501*   Sepsis Labs: Recent Labs  Lab 01/11/22 1336  LATICACIDVEN 0.9    Recent Results (from the past 240 hour(s))  Culture, blood (routine x 2)     Status: None (Preliminary result)   Collection Time: 01/11/22  1:36 PM   Specimen: BLOOD  Result Value Ref Range Status   Specimen Description   Final    BLOOD LEFT ANTECUBITAL Performed at [facility], [address] [place], [place]    Special Requests   Final    BOTTLES DRAWN AEROBIC AND ANAEROBIC Blood Culture results may not be optimal due to an inadequate volume of blood received in culture bottles Performed at [facility], [address] [place], [place]    Culture   Final    NO GROWTH 4 DAYS Performed at [facility], [address] [place], [address]  [place]    Report Status PENDING  Incomplete  Resp panel by RT-PCR (RSV, Flu A&B, Covid) Anterior Nasal Swab     Status: None   Collection Time: 01/11/22  1:36 PM   Specimen: Anterior Nasal Swab  Result Value Ref Range Status   SARS Coronavirus 2 by RT PCR NEGATIVE NEGATIVE Final    Comment: (NOTE) SARS-CoV-2 target nucleic acids are NOT DETECTED.  The SARS-CoV-2 RNA is generally detectable in upper respiratory specimens during the acute phase of infection. The lowest concentration of SARS-CoV-2 viral copies this assay can detect is 138 copies/mL. A negative result does not preclude SARS-Cov-2 infection and should not be used as the sole basis for treatment or other patient management decisions. A negative result may occur with  improper specimen collection/handling, submission of specimen other than nasopharyngeal swab, presence of viral mutation(s) within  the areas targeted by this assay, and inadequate number of viral copies(<138 copies/mL). A negative result must be combined with clinical observations, patient history, and epidemiological information. The expected result is Negative.  Fact Sheet for Patients:  BloggerCourse.com  Fact Sheet for Healthcare Providers:  SeriousBroker.it  This test is no t yet approved or cleared by the [place] FDA and  has been authorized for detection and/or diagnosis of SARS-CoV-2 by FDA under an Emergency Use Authorization (EUA). This EUA will remain  in effect (meaning this test can be used) for the duration of the COVID-19 declaration under Section 564(b)(1) of the Act, 21 U.S.C.section 360bbb-3(b)(1), unless the authorization is terminated  or revoked sooner.       Influenza A by PCR NEGATIVE NEGATIVE Final   Influenza B by PCR NEGATIVE NEGATIVE Final    Comment: (NOTE) The Xpert Xpress SARS-CoV-2/FLU/RSV plus assay is intended as an aid in the diagnosis of influenza from  Nasopharyngeal swab specimens and should not be used as a sole basis for treatment. Nasal washings and aspirates are unacceptable for Xpert Xpress SARS-CoV-2/FLU/RSV testing.  Fact Sheet for Patients: BloggerCourse.com  Fact Sheet for Healthcare Providers: SeriousBroker.it  This test is not yet approved or cleared by the [place] FDA and has been authorized for detection and/or diagnosis of SARS-CoV-2 by FDA under an Emergency Use Authorization (EUA). This EUA will remain in effect (meaning this test can be used) for the duration of the COVID-19 declaration under Section 564(b)(1) of the Act, 21 U.S.C. section 360bbb-3(b)(1), unless the authorization is terminated or revoked.     Resp Syncytial Virus by PCR NEGATIVE NEGATIVE Final    Comment: (NOTE) Fact Sheet for Patients: BloggerCourse.com  Fact Sheet for Healthcare Providers: SeriousBroker.it  This test is not yet approved or cleared by the [place] FDA and has been authorized for detection and/or diagnosis of SARS-CoV-2 by FDA under an Emergency Use Authorization (EUA). This EUA will remain in effect (meaning this test can be used) for the duration of the COVID-19 declaration under Section 564(b)(1) of the Act, 21 U.S.C. section 360bbb-3(b)(1), unless the authorization is terminated or revoked.  Performed at [facility], [address] [place], [place] [address]   Culture, blood (routine x 2)     Status: None (Preliminary result)   Collection Time: 01/11/22  1:41 PM   Specimen: BLOOD  Result Value Ref Range Status   Specimen Description   Final    BLOOD LEFT ANTECUBITAL Performed at [facility], [address], [place], [place] [address]    Special Requests   Final    BOTTLES DRAWN AEROBIC AND ANAEROBIC Blood Culture adequate volume Performed at [facility], [address] [place], [place] [address]    Culture   Final    NO GROWTH 4 DAYS Performed at [facility], [address], [place], [place] [address]    Report Status PENDING  Incomplete         Radiology Studies: No results found.      Scheduled Meds:  feeding supplement  237 mL Oral BID BM   melatonin  6 mg Oral QHS   memantine  10 mg Oral BID   risperiDONE  0.5 mg Oral QHS   sertraline  200 mg Oral q AM   traZODone  50 mg Oral QHS   Continuous Infusions:  sodium chloride 50 mL/hr at 01/16/22 0800     LOS: 4 days  Time spent= 35 mins    Andyn Sales Joline Maxcyhirag Macky Galik, MD [facility]  If 7PM-7AM, please contact night-coverage  01/16/2022, 8:39 AM

## 2022-01-16 NOTE — Plan of Care (Signed)
Patient alert to voice but doesn't answer orientation questions and doesn't follow commands.  Pt impulsive, poor judgement and safety concern.  Falls risk, so bed alarm activated.  Pt had many bladder incontinence episodes and tried to get OOB several times.  PRN ativan given for anxiety and PRN percocet given for pain.  All meds given on time crushed I apple sauce.  Pt used bedside commode and voided .  POC maintained, will continue to monitor.  Problem: Education: Goal: Knowledge of General Education information will improve Description: Including pain rating scale, medication(s)/side effects and non-pharmacologic comfort measures Outcome: Progressing   Problem: Health Behavior/Discharge Planning: Goal: Ability to manage health-related needs will improve Outcome: Progressing   Problem: Clinical Measurements: Goal: Ability to maintain clinical measurements within normal limits will improve Outcome: Progressing Goal: Will remain free from infection Outcome: Progressing Goal: Diagnostic test results will improve Outcome: Progressing Goal: Respiratory complications will improve Outcome: Progressing Goal: Cardiovascular complication will be avoided Outcome: Progressing   Problem: Activity: Goal: Risk for activity intolerance will decrease Outcome: Progressing   Problem: Nutrition: Goal: Adequate nutrition will be maintained Outcome: Progressing   Problem: Coping: Goal: Level of anxiety will decrease Outcome: Progressing   Problem: Elimination: Goal: Will not experience complications related to bowel motility Outcome: Progressing Goal: Will not experience complications related to urinary retention Outcome: Progressing   Problem: Pain Managment: Goal: General experience of comfort will improve Outcome: Progressing   Problem: Safety: Goal: Ability to remain free from injury will improve Outcome: Progressing   Problem: Skin Integrity: Goal: Risk for impaired skin  integrity will decrease Outcome: Progressing   Problem: Safety: Goal: Non-violent Restraint(s) Outcome: Progressing

## 2022-01-16 NOTE — TOC Progression Note (Signed)
Transition of Care [facility]) - Progression Note    Patient Details  Name: Diana Horton MRN: [id number] Date of Birth: 09/12/1940  [facility] CM/SW Contact  Beckie Busing, RN Phone [phone]  01/16/2022, 9:51 AM  Clinical Narrative:    CM received message that patients son would like to speak with CM in reference to discharge plans for patient. CM called son [name].  Son states that patient is currently at [facility] where he took out a high interest loan to pay for her care there. Son states that he was told by [facility] that medicare will pay for 24 hour care for patient. CM has clarified for son that Medicare will pay for short term SNF with no cost for the first 21 days and then after there is a daily copay. Son states that all of the money that he borrowed is gone and patients house has been sold and her things auctioned off but son does not have access to money. Son states that attorneys have the money and he has been unsuccessful in getting the money. Son states that he owes [place] $2550 plus he has a bill for [phone] coming up.. Son states that he can not provide 24 hour care and he can not afford to pay for patient to be in a facility. Son continues to inform CM that attorney is the guardian of the patients estate. Cm has advised son to apply for medicaid and son states that he will do that today. For now son states that he is ok with patient returning to [facility] if therapy feels that she is safe to return.     Barriers to Discharge: Continued Medical Work up  Expected Discharge Plan and Services   Discharge Planning Services: CM Consult   Living arrangements for the past 2 months: Assisted Living Facility (Memory Care Unit)                                       Social Determinants of Health (SDOH) Interventions SDOH Screenings   Tobacco Use: Unknown (01/13/2022)    Readmission Risk Interventions     No data to display

## 2022-01-17 DIAGNOSIS — J942 Hemothorax: Secondary | ICD-10-CM | POA: Diagnosis not present

## 2022-01-17 LAB — CBC
HCT: 33 % — ABNORMAL LOW (ref 36.0–46.0)
Hemoglobin: 10 g/dL — ABNORMAL LOW (ref 12.0–15.0)
MCH: 27.5 pg (ref 26.0–34.0)
MCHC: 30.3 g/dL (ref 30.0–36.0)
MCV: 90.9 fL (ref 80.0–100.0)
Platelets: 376 10*3/uL (ref 150–400)
RBC: 3.63 MIL/uL — ABNORMAL LOW (ref 3.87–5.11)
RDW: 16.6 % — ABNORMAL HIGH (ref 11.5–15.5)
WBC: 8.7 10*3/uL (ref 4.0–10.5)
nRBC: 0 % (ref 0.0–0.2)

## 2022-01-17 LAB — BASIC METABOLIC PANEL
Anion gap: 9 (ref 5–15)
BUN: 16 mg/dL (ref 8–23)
CO2: 27 mmol/L (ref 22–32)
Calcium: 8.9 mg/dL (ref 8.9–10.3)
Chloride: 102 mmol/L (ref 98–111)
Creatinine, Ser: 0.9 mg/dL (ref 0.44–1.00)
GFR, Estimated: >60 mL/min (ref >60)
Glucose, Bld: 112 mg/dL — ABNORMAL HIGH (ref 70–99)
Potassium: 3.4 mmol/L — ABNORMAL LOW (ref 3.5–5.1)
Sodium: 138 mmol/L (ref 135–145)

## 2022-01-17 LAB — GLUCOSE, CAPILLARY
Glucose-Capillary: 52 mg/dL — ABNORMAL LOW (ref 70–99)
Glucose-Capillary: 61 mg/dL — ABNORMAL LOW (ref 70–99)
Glucose-Capillary: 64 mg/dL — ABNORMAL LOW (ref 70–99)
Glucose-Capillary: 70 mg/dL (ref 70–99)
Glucose-Capillary: 76 mg/dL (ref 70–99)
Glucose-Capillary: 94 mg/dL (ref 70–99)

## 2022-01-17 LAB — MAGNESIUM: Magnesium: 2.2 mg/dL (ref 1.7–2.4)

## 2022-01-17 MED ORDER — DEXTROSE 50 % IV SOLN
25.0000 g | Freq: Once | INTRAVENOUS | Status: DC
  Filled 2022-01-17: qty 50

## 2022-01-17 MED ORDER — POTASSIUM CHLORIDE 20 MEQ PO PACK
40.0000 meq | PACK | Freq: Once | ORAL | Status: AC
  Administered 2022-01-17: 40 meq via ORAL
  Filled 2022-01-17: qty 2

## 2022-01-17 MED ORDER — OLANZAPINE 10 MG IM SOLR
5.0000 mg | Freq: Once | INTRAMUSCULAR | Status: AC
  Administered 2022-01-17: 5 mg via INTRAMUSCULAR
  Filled 2022-01-17: qty 10

## 2022-01-17 MED ORDER — GLUCAGON HCL RDNA (DIAGNOSTIC) 1 MG IJ SOLR: 1.0000 mg | Freq: Once | INTRAMUSCULAR | Status: DC | PRN

## 2022-01-17 MED ORDER — SODIUM CHLORIDE 0.9 % IV SOLN
1.0000 g | INTRAVENOUS | Status: AC
  Administered 2022-01-17 – 2022-01-21 (×5): 1 g via INTRAVENOUS
  Filled 2022-01-17 (×5): qty 10

## 2022-01-17 MED ORDER — DEXTROSE-NACL 5-0.9 % IV SOLN: INTRAVENOUS | Status: DC

## 2022-01-17 NOTE — Progress Notes (Signed)
Patient with increased agitation/combativeness. Attempting to kick, hit, and bite staff. Attempted to provide reassurance to patient but patient was non receptive to verbal de escalation and diversional activities. MD notified, please see additional orders

## 2022-01-17 NOTE — Progress Notes (Signed)
Attempted to call son [name] to update him on the need for soft wrist restraints. Left a voicemail with a callback number.

## 2022-01-17 NOTE — Progress Notes (Signed)
PROGRESS NOTE    LILLA CALLEJO  YQM:[id number] DOB: 05-Sep-1940 DOA: 01/11/2022 PCP: Pcp, No   Brief Narrative:  81 year old female with history of dementia, osteoporosis, B12 deficiency admitted for fall from [facility] memory care unit.  Her fall was unwitnessed and unfortunately sustained another fall while in the hospital on 12/8.  She was found to have multiple acute right-sided rib fracture with hemothorax.  EDP discussed case with trauma surgery who recommended conservative management at this time and did not require any chest tube.  Off-and-on during hospitalization patient has had increasing agitation requiring Haldol, Zyprexa and Xanax.  Eventually UA eventually UA was suggestive of urinary tract infection therefore started on empiric IV Rocephin.  Cultures were ordered.   Assessment & Plan:  Principal Problem:   Hemothorax, right Active Problems:   Hemothorax on right   Protein-calorie malnutrition, severe   Multiple acute right rib fracture. Subacute/chronic right-sided hemothorax-traumatic History of osteoporosis  These are pathologic fracture, underwent thoracentesis.  Appears to have complex fibrinous exudate.  EDP consulted with trauma surgery who did not think patient will require chest tube.  Pain control and conservative measures    History of recurrent fall. X-ray hip, CT head, CT chest, CT C-spine and CT abdomen and pelvis were performed.  Only showing acute rib fracture, stable T7 fracture.  No other obvious evidence of acute trauma.  Urinary tract infection - Possibly worsening her encephalopathy.  Urine cultures ordered.  Empiric IV Rocephin  Abnormal breath sounds - Elevated BNP.  Fluids stopped, received Lasix   History of severe dementia. Intermittent agitation Off-and-on agitation.  She is at risk of getting out of bed and falling.  EKG shows normal QTc, as needed Zyprexa, also received as needed Haldol Bedtime Risperdal. 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 Recommendation is for semiannual imaging. Monitor.   Goals of care conversation. Son confirms DNR   Given that there is a risk for hemothorax currently holding off on chemical DVT prophylaxis   Adult failure to thrive. Severe protein calorie malnutrition Encourage oral intake.  Dietitian consulted  DVT prophylaxis: SCDs Start: 01/11/22 1816 Place TED hose Start: 01/11/22 1816 Code Status: DNR Family Communication: [name] updated periodically  Status is: Inpatient Remains inpatient appropriate because: Currently agitated requiring medication to help this.   Subjective: Calm but confused.  Remains very hard of hearing  Examination: Constitutional: Not in acute distress.  Cachectic frail.  Appears chronically ill Respiratory: Some bilateral rhonchi Cardiovascular: Normal sinus rhythm, no rubs Abdomen: Nontender nondistended good bowel sounds Musculoskeletal: No edema noted Skin: No rashes seen Neurologic: CN 2-12 grossly intact.  And nonfocal Psychiatric: Alert to name only.  Poor judgment and insight  Objective: Vitals:   01/16/22 1349 01/16/22 1951 01/16/22 2024 01/17/22 0612  BP: 131/85  102/67 (!) 167/99  Pulse: 90  86 95  Resp: 20  18 16   Temp:   98 F (36.7 C) 98 F (36.7 C)  TempSrc:   Oral Oral  SpO2: 91% 90% 100% 92%  Weight:      Height:        Intake/Output Summary (Last 24 hours) at 01/17/2022 1142 Last data filed at 01/16/2022 1900 Gross per 24 hour  Intake 120 ml  Output --  Net 120 ml    Filed Weights   01/13/22 1851  Weight: 45 kg     Data Reviewed:   CBC: Recent Labs  Lab 01/11/22 1336 01/12/22 0759 01/13/22 0815 01/14/22 0842 01/15/22 0735 01/16/22 0545 01/17/22 0744  WBC 11.0*   < > 7.7 7.5 9.6 10.9* 8.7  NEUTROABS 8.9*  --   --   --   --   --   --   HGB 9.0*   < > 9.3* 9.5* 9.2* 9.6* 10.0*  HCT 28.8*   < > 30.5* 31.5* 30.1*  31.7* 33.0*  MCV 90.0   < > 90.5 91.3 90.7 91.1 90.9  PLT 361   < > 377 366 357 380 376   < > = values in this interval not displayed.   Basic Metabolic Panel: Recent Labs  Lab 01/13/22 0815 01/14/22 0842 01/15/22 0735 01/16/22 0545 01/17/22 0744  NA 139 140 135 142 138  K 3.6 3.8 3.7 4.0 3.4*  CL 106 108 102 107 102  CO2 26 28 26 26 27   GLUCOSE 101* 108* 106* 107* 112*  BUN 24* 19 19 14 16   CREATININE 0.81 0.87 0.84 0.82 0.90  CALCIUM 8.3* 8.5* 8.4* 8.8* 8.9  MG  --   --  2.1 2.3 2.2   GFR: Estimated Creatinine Clearance: 35.4 mL/min (by C-G formula based on SCr of 0.9 mg/dL). Liver Function Tests: Recent Labs  Lab 01/12/22 0759  AST 20  ALT 17  ALKPHOS 83  BILITOT 0.9  PROT 6.6  ALBUMIN 3.6   No results for input(s): "LIPASE", "AMYLASE" in the last 168 hours. No results for input(s): "AMMONIA" in the last 168 hours. Coagulation Profile: No results for input(s): "INR", "PROTIME" in the last 168 hours. Cardiac Enzymes: No results for input(s): "CKTOTAL", "CKMB", "CKMBINDEX", "TROPONINI" in the last 168 hours. BNP (last 3 results) No results for input(s): "PROBNP" in the last 8760 hours. HbA1C: No results for input(s): "HGBA1C" in the last 72 hours. CBG: Recent Labs  Lab 01/16/22 1850 01/17/22 0016  GLUCAP 96 94   Lipid Profile: No results for input(s): "CHOL", "HDL", "LDLCALC", "TRIG", "CHOLHDL", "LDLDIRECT" in the last 72 hours. Thyroid Function Tests: No results for input(s): "TSH", "T4TOTAL", "FREET4", "T3FREE", "THYROIDAB" in the last 72 hours.  Anemia Panel: No results for input(s): "VITAMINB12", "FOLATE", "FERRITIN", "TIBC", "IRON", "RETICCTPCT" in the last 72 hours.  Sepsis Labs: Recent Labs  Lab 01/11/22 1336  LATICACIDVEN 0.9    Recent Results (from the past 240 hour(s))  Culture, blood (routine x 2)     Status: None   Collection Time: 01/11/22  1:36 PM   Specimen: BLOOD  Result Value Ref Range Status   Specimen Description   Final     BLOOD LEFT ANTECUBITAL Performed at [facility], [address] [place], [place] [address]    Special Requests   Final    BOTTLES DRAWN AEROBIC AND ANAEROBIC Blood Culture results may not be optimal due to an inadequate volume of blood received in culture bottles Performed at [facility], [address] [place], [place] [address]    Culture   Final    NO GROWTH 5 DAYS Performed at [facility], [address] [place], [place] [address]    Report Status 01/16/2022 FINAL  Final  Resp panel by RT-PCR (RSV, Flu A&B, Covid) Anterior Nasal Swab     Status: None   Collection Time: 01/11/22  1:36 PM   Specimen: Anterior Nasal Swab  Result Value Ref Range Status   SARS Coronavirus 2 by RT PCR NEGATIVE NEGATIVE Final    Comment: (NOTE) SARS-CoV-2 target nucleic  acids are NOT DETECTED.  The SARS-CoV-2 RNA is generally detectable in upper respiratory specimens during the acute phase of infection. The lowest concentration of SARS-CoV-2 viral copies this assay can detect is 138 copies/mL. A negative result does not preclude SARS-Cov-2 infection and should not be used as the sole basis for treatment or other patient management decisions. A negative result may occur with  improper specimen collection/handling, submission of specimen other than nasopharyngeal swab, presence of viral mutation(s) within the areas targeted by this assay, and inadequate number of viral copies(<138 copies/mL). A negative result must be combined with clinical observations, patient history, and epidemiological information. The expected result is Negative.  Fact Sheet for Patients:  BloggerCourse.com  Fact Sheet for Healthcare Providers:  SeriousBroker.it  This test is no t yet approved or cleared by the [place] FDA and  has been authorized for detection and/or diagnosis of SARS-CoV-2 by FDA under an Emergency Use  Authorization (EUA). This EUA will remain  in effect (meaning this test can be used) for the duration of the COVID-19 declaration under Section 564(b)(1) of the Act, 21 U.S.C.section 360bbb-3(b)(1), unless the authorization is terminated  or revoked sooner.       Influenza A by PCR NEGATIVE NEGATIVE Final   Influenza B by PCR NEGATIVE NEGATIVE Final    Comment: (NOTE) The Xpert Xpress SARS-CoV-2/FLU/RSV plus assay is intended as an aid in the diagnosis of influenza from Nasopharyngeal swab specimens and should not be used as a sole basis for treatment. Nasal washings and aspirates are unacceptable for Xpert Xpress SARS-CoV-2/FLU/RSV testing.  Fact Sheet for Patients: BloggerCourse.com  Fact Sheet for Healthcare Providers: SeriousBroker.it  This test is not yet approved or cleared by the [place] FDA and has been authorized for detection and/or diagnosis of SARS-CoV-2 by FDA under an Emergency Use Authorization (EUA). This EUA will remain in effect (meaning this test can be used) for the duration of the COVID-19 declaration under Section 564(b)(1) of the Act, 21 U.S.C. section 360bbb-3(b)(1), unless the authorization is terminated or revoked.     Resp Syncytial Virus by PCR NEGATIVE NEGATIVE Final    Comment: (NOTE) Fact Sheet for Patients: BloggerCourse.com  Fact Sheet for Healthcare Providers: SeriousBroker.it  This test is not yet approved or cleared by the [place] FDA and has been authorized for detection and/or diagnosis of SARS-CoV-2 by FDA under an Emergency Use Authorization (EUA). This EUA will remain in effect (meaning this test can be used) for the duration of the COVID-19 declaration under Section 564(b)(1) of the Act, 21 U.S.C. section 360bbb-3(b)(1), unless the authorization is terminated or revoked.  Performed at [facility],  [address] [place], [place] [address]   Culture, blood (routine x 2)     Status: None   Collection Time: 01/11/22  1:41 PM   Specimen: BLOOD  Result Value Ref Range Status   Specimen Description   Final    BLOOD LEFT ANTECUBITAL Performed at [facility], [address], [place], [place] [address]    Special Requests   Final    BOTTLES DRAWN AEROBIC AND ANAEROBIC Blood Culture adequate volume Performed at [facility], [address] [place], [place] [address]    Culture   Final    NO GROWTH 5 DAYS Performed at [facility], [address] [place], [place] [address]    Report Status 01/16/2022 FINAL  Final         Radiology Studies: No  results found.      Scheduled Meds:  feeding supplement  237 mL Oral BID BM   ipratropium-albuterol  3 mL Nebulization BID   melatonin  6 mg Oral QHS   memantine  10 mg Oral BID   potassium chloride  40 mEq Oral Once   risperiDONE  1 mg Oral QHS   sertraline  200 mg Oral q AM   traZODone  50 mg Oral QHS   Continuous Infusions:  cefTRIAXone (ROCEPHIN)  IV       LOS: 5 days   Time spent= 35 mins    Chantell Kunkler Joline Maxcy, MD [facility]  If 7PM-7AM, please contact night-coverage  01/17/2022, 11:42 AM

## 2022-01-18 ENCOUNTER — Inpatient Hospital Stay (HOSPITAL_COMMUNITY): Payer: Medicare HMO

## 2022-01-18 DIAGNOSIS — J942 Hemothorax: Secondary | ICD-10-CM | POA: Diagnosis not present

## 2022-01-18 LAB — BASIC METABOLIC PANEL
Anion gap: 7 (ref 5–15)
BUN: 23 mg/dL (ref 8–23)
CO2: 28 mmol/L (ref 22–32)
Calcium: 8.9 mg/dL (ref 8.9–10.3)
Chloride: 107 mmol/L (ref 98–111)
Creatinine, Ser: 0.93 mg/dL (ref 0.44–1.00)
GFR, Estimated: >60 mL/min (ref >60)
Glucose, Bld: 109 mg/dL — ABNORMAL HIGH (ref 70–99)
Potassium: 4.3 mmol/L (ref 3.5–5.1)
Sodium: 142 mmol/L (ref 135–145)

## 2022-01-18 LAB — CBC
HCT: 30.8 % — ABNORMAL LOW (ref 36.0–46.0)
Hemoglobin: 9.2 g/dL — ABNORMAL LOW (ref 12.0–15.0)
MCH: 27.3 pg (ref 26.0–34.0)
MCHC: 29.9 g/dL — ABNORMAL LOW (ref 30.0–36.0)
MCV: 91.4 fL (ref 80.0–100.0)
Platelets: 375 10*3/uL (ref 150–400)
RBC: 3.37 MIL/uL — ABNORMAL LOW (ref 3.87–5.11)
RDW: 16.3 % — ABNORMAL HIGH (ref 11.5–15.5)
WBC: 8.3 10*3/uL (ref 4.0–10.5)
nRBC: 0 % (ref 0.0–0.2)

## 2022-01-18 LAB — GLUCOSE, CAPILLARY
Glucose-Capillary: 107 mg/dL — ABNORMAL HIGH (ref 70–99)
Glucose-Capillary: 125 mg/dL — ABNORMAL HIGH (ref 70–99)
Glucose-Capillary: 85 mg/dL (ref 70–99)
Glucose-Capillary: 96 mg/dL (ref 70–99)

## 2022-01-18 LAB — MAGNESIUM: Magnesium: 2.2 mg/dL (ref 1.7–2.4)

## 2022-01-18 MED ORDER — HALOPERIDOL LACTATE 5 MG/ML IJ SOLN
2.0000 mg | Freq: Four times a day (QID) | INTRAMUSCULAR | Status: DC | PRN
  Administered 2022-01-21 – 2022-01-23 (×2): 2 mg via INTRAVENOUS
  Filled 2022-01-18 (×2): qty 1

## 2022-01-18 MED ORDER — MORPHINE SULFATE (PF) 2 MG/ML IV SOLN
1.0000 mg | INTRAVENOUS | Status: DC | PRN
  Administered 2022-01-18 – 2022-01-22 (×3): 1 mg via INTRAVENOUS
  Filled 2022-01-18 (×3): qty 1

## 2022-01-18 MED ORDER — RISPERIDONE 1 MG PO TABS
2.0000 mg | ORAL_TABLET | Freq: Every day | ORAL | Status: DC
  Administered 2022-01-18 – 2022-01-24 (×7): 2 mg via ORAL
  Filled 2022-01-18 (×7): qty 2

## 2022-01-18 MED ORDER — FUROSEMIDE 10 MG/ML IJ SOLN
40.0000 mg | Freq: Four times a day (QID) | INTRAMUSCULAR | Status: AC
  Administered 2022-01-18 – 2022-01-19 (×2): 40 mg via INTRAVENOUS
  Filled 2022-01-18 (×2): qty 4

## 2022-01-18 MED ORDER — FUROSEMIDE 10 MG/ML IJ SOLN: 40.0000 mg | Freq: Once | INTRAMUSCULAR | Status: DC

## 2022-01-18 NOTE — Progress Notes (Addendum)
Patient was having increase in cough and phelgm production. CXR ordered showing white out of the right lung, likely from fluid. Currently on 2L [place], doesn't appear to be tachypenic.  For now Will order Lasix 40mg  IV x 2 doses. Repeat CXR in the morning, if necessary, she may need thoracentesis especially if BP and Renal fx doesn't tolerate lasix.  Will order foley as well for accurate input and output.   She does have hx of hemothorax and had undergone recent  thoracentesis with IR on 12/18. 150 cc of blood fluid was removed.  Hb is stable at this time. Currently on SCD's, not on any Anticoagulation.  1/19 MD [facility]

## 2022-01-18 NOTE — Progress Notes (Signed)
PROGRESS NOTE    Diana Horton  PIR:[id number] DOB: 15-Sep-1940 DOA: 01/11/2022 PCP: Pcp, No   Brief Narrative:  81 year old female with history of dementia, osteoporosis, B12 deficiency admitted for fall from [facility] memory care unit.  Her fall was unwitnessed and unfortunately sustained another fall while in the hospital on 12/8.  She was found to have multiple acute right-sided rib fracture with hemothorax.  EDP discussed case with trauma surgery who recommended conservative management at this time and did not require any chest tube.  Off-and-on during hospitalization patient has had increasing agitation requiring Haldol, Zyprexa and Xanax.  Eventually UA eventually UA was suggestive of urinary tract infection therefore started on empiric IV Rocephin.  Cultures were ordered.   Assessment & Plan:  Principal Problem:   Hemothorax, right Active Problems:   Hemothorax on right   Protein-calorie malnutrition, severe   Multiple acute right rib fracture. Subacute/chronic right-sided hemothorax-traumatic History of osteoporosis  These are pathologic fracture, underwent thoracentesis.  Appears to have complex fibrinous exudate.  EDP consulted with trauma surgery who did not think patient will require chest tube.  Pain control and conservative measures    History of recurrent fall. X-ray hip, CT head, CT chest, CT C-spine and CT abdomen and pelvis were performed.  Only showing acute rib fracture, stable T7 fracture.  No other obvious evidence of acute trauma.  Urinary tract infection - Possibly worsening her encephalopathy.  Urine cultures pending.  Empiric IV Rocephin  Abnormal breath sounds - Fluids had to be started due to clinical signs of dehydration and soft blood pressure.  Doing better this morning   History of severe dementia. Intermittent agitation Off-and-on agitation.  She is at risk of getting out of bed and falling.  EKG shows normal QTc, as needed Zyprexa, also  received as needed Haldol.  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 Recommendation is for semiannual imaging. Monitor.   Goals of care conversation. Son confirms DNR   Given that there is a risk for hemothorax currently holding off on chemical DVT prophylaxis   Adult failure to thrive. Severe protein calorie malnutrition Encourage oral intake.  Dietitian consulted  DVT prophylaxis: SCDs Start: 01/11/22 1816 Place TED hose Start: 01/11/22 1816 Code Status: DNR Family Communication: [name] updated periodically  Status is: Inpatient Remains inpatient appropriate because: Currently agitated requiring medication to help this.   Subjective: In the night patient did have issues with agitation requiring soft restraints as she was pulling IV and trying to get out of bed.  This morning appears to be calm and watching TV.  Does not carry on any meaningful conversation otherwise.  Follows very basic commands.  Examination: Constitutional: Not in acute distress.  Cachectic frail.  Appears chronically ill Respiratory: Some bilateral rhonchi Cardiovascular: Normal sinus rhythm, no rubs Abdomen: Nontender nondistended good bowel sounds Musculoskeletal: No edema noted Skin: No rashes seen Neurologic: CN 2-12 grossly intact.  And nonfocal Psychiatric: Alert to name only.  Poor judgment and insight  Objective: Vitals:   01/17/22 1311 01/17/22 1930 01/18/22 0454 01/18/22 0818  BP: 121/82 112/71 112/85   Pulse: 87 86 (!) 51   Resp: 18 18 18    Temp: 97.6 F (36.4 C) 98.1 F (36.7 C) 97.7 F (36.5 C)   TempSrc: Oral Oral Oral   SpO2: 97%  92% 94%  Weight:      Height:  Intake/Output Summary (Last 24 hours) at 01/18/2022 1025 Last data filed at 01/18/2022 0900 Gross per 24 hour  Intake 520.63 ml  Output 225 ml  Net 295.63 ml    Filed  Weights   01/13/22 1851  Weight: 45 kg     Data Reviewed:   CBC: Recent Labs  Lab 01/11/22 1336 01/12/22 0759 01/14/22 0842 01/15/22 0735 01/16/22 0545 01/17/22 0744 01/18/22 0632  WBC 11.0*   < > 7.5 9.6 10.9* 8.7 8.3  NEUTROABS 8.9*  --   --   --   --   --   --   HGB 9.0*   < > 9.5* 9.2* 9.6* 10.0* 9.2*  HCT 28.8*   < > 31.5* 30.1* 31.7* 33.0* 30.8*  MCV 90.0   < > 91.3 90.7 91.1 90.9 91.4  PLT 361   < > 366 357 380 376 375   < > = values in this interval not displayed.   Basic Metabolic Panel: Recent Labs  Lab 01/14/22 0842 01/15/22 0735 01/16/22 0545 01/17/22 0744 01/18/22 0632  NA 140 135 142 138 142  K 3.8 3.7 4.0 3.4* 4.3  CL 108 102 107 102 107  CO2 28 26 26 27 28   GLUCOSE 108* 106* 107* 112* 109*  BUN 19 19 14 16 23   CREATININE 0.87 0.84 0.82 0.90 0.93  CALCIUM 8.5* 8.4* 8.8* 8.9 8.9  MG  --  2.1 2.3 2.2 2.2   GFR: Estimated Creatinine Clearance: 34.3 mL/min (by C-G formula based on SCr of 0.93 mg/dL). Liver Function Tests: Recent Labs  Lab 01/12/22 0759  AST 20  ALT 17  ALKPHOS 83  BILITOT 0.9  PROT 6.6  ALBUMIN 3.6   No results for input(s): "LIPASE", "AMYLASE" in the last 168 hours. No results for input(s): "AMMONIA" in the last 168 hours. Coagulation Profile: No results for input(s): "INR", "PROTIME" in the last 168 hours. Cardiac Enzymes: No results for input(s): "CKTOTAL", "CKMB", "CKMBINDEX", "TROPONINI" in the last 168 hours. BNP (last 3 results) No results for input(s): "PROBNP" in the last 8760 hours. HbA1C: No results for input(s): "HGBA1C" in the last 72 hours. CBG: Recent Labs  Lab 01/17/22 1216 01/17/22 1247 01/17/22 1731 01/18/22 0050 01/18/22 0631  GLUCAP 64* 76 70 96 107*   Lipid Profile: No results for input(s): "CHOL", "HDL", "LDLCALC", "TRIG", "CHOLHDL", "LDLDIRECT" in the last 72 hours. Thyroid Function Tests: No results for input(s): "TSH", "T4TOTAL", "FREET4", "T3FREE", "THYROIDAB" in the last 72  hours.  Anemia Panel: No results for input(s): "VITAMINB12", "FOLATE", "FERRITIN", "TIBC", "IRON", "RETICCTPCT" in the last 72 hours.  Sepsis Labs: Recent Labs  Lab 01/11/22 1336  LATICACIDVEN 0.9    Recent Results (from the past 240 hour(s))  Culture, blood (routine x 2)     Status: None   Collection Time: 01/11/22  1:36 PM   Specimen: BLOOD  Result Value Ref Range Status   Specimen Description   Final    BLOOD LEFT ANTECUBITAL Performed at [facility], [address], [place], [place]    Special Requests   Final    BOTTLES DRAWN AEROBIC AND ANAEROBIC Blood Culture results may not be optimal due to an inadequate volume of blood received in culture bottles Performed at [facility], [address] [place], [place]    Culture   Final    NO GROWTH 5 DAYS Performed at [facility], [address], [place], [address] [place]    Report  Status 01/16/2022 FINAL  Final  Resp panel by RT-PCR (RSV, Flu A&B, Covid) Anterior Nasal Swab     Status: None   Collection Time: 01/11/22  1:36 PM   Specimen: Anterior Nasal Swab  Result Value Ref Range Status   SARS Coronavirus 2 by RT PCR NEGATIVE NEGATIVE Final    Comment: (NOTE) SARS-CoV-2 target nucleic acids are NOT DETECTED.  The SARS-CoV-2 RNA is generally detectable in upper respiratory specimens during the acute phase of infection. The lowest concentration of SARS-CoV-2 viral copies this assay can detect is 138 copies/mL. A negative result does not preclude SARS-Cov-2 infection and should not be used as the sole basis for treatment or other patient management decisions. A negative result may occur with  improper specimen collection/handling, submission of specimen other than nasopharyngeal swab, presence of viral mutation(s) within the areas targeted by this assay, and inadequate number of viral copies(<138 copies/mL). A negative result must be combined  with clinical observations, patient history, and epidemiological information. The expected result is Negative.  Fact Sheet for Patients:  BloggerCourse.com  Fact Sheet for Healthcare Providers:  SeriousBroker.it  This test is no t yet approved or cleared by the [place] FDA and  has been authorized for detection and/or diagnosis of SARS-CoV-2 by FDA under an Emergency Use Authorization (EUA). This EUA will remain  in effect (meaning this test can be used) for the duration of the COVID-19 declaration under Section 564(b)(1) of the Act, 21 U.S.C.section 360bbb-3(b)(1), unless the authorization is terminated  or revoked sooner.       Influenza A by PCR NEGATIVE NEGATIVE Final   Influenza B by PCR NEGATIVE NEGATIVE Final    Comment: (NOTE) The Xpert Xpress SARS-CoV-2/FLU/RSV plus assay is intended as an aid in the diagnosis of influenza from Nasopharyngeal swab specimens and should not be used as a sole basis for treatment. Nasal washings and aspirates are unacceptable for Xpert Xpress SARS-CoV-2/FLU/RSV testing.  Fact Sheet for Patients: BloggerCourse.com  Fact Sheet for Healthcare Providers: SeriousBroker.it  This test is not yet approved or cleared by the [place] FDA and has been authorized for detection and/or diagnosis of SARS-CoV-2 by FDA under an Emergency Use Authorization (EUA). This EUA will remain in effect (meaning this test can be used) for the duration of the COVID-19 declaration under Section 564(b)(1) of the Act, 21 U.S.C. section 360bbb-3(b)(1), unless the authorization is terminated or revoked.     Resp Syncytial Virus by PCR NEGATIVE NEGATIVE Final    Comment: (NOTE) Fact Sheet for Patients: BloggerCourse.com  Fact Sheet for Healthcare Providers: SeriousBroker.it  This test is not yet approved  or cleared by the [place] FDA and has been authorized for detection and/or diagnosis of SARS-CoV-2 by FDA under an Emergency Use Authorization (EUA). This EUA will remain in effect (meaning this test can be used) for the duration of the COVID-19 declaration under Section 564(b)(1) of the Act, 21 U.S.C. section 360bbb-3(b)(1), unless the authorization is terminated or revoked.  Performed at [facility], [address], [place], [place] [address]   Culture, blood (routine x 2)     Status: None   Collection Time: 01/11/22  1:41 PM   Specimen: BLOOD  Result Value Ref Range Status   Specimen Description   Final    BLOOD LEFT ANTECUBITAL Performed at [facility], [address] [place], [place] [address]    Special Requests   Final    BOTTLES DRAWN AEROBIC AND ANAEROBIC Blood Culture  adequate volume Performed at [facility], [address] [place], [place] [address]    Culture   Final    NO GROWTH 5 DAYS Performed at [facility], [address] [place], [place] [address]    Report Status 01/16/2022 FINAL  Final         Radiology Studies: No results found.      Scheduled Meds:  dextrose  25 g Intravenous Once   feeding supplement  237 mL Oral BID BM   ipratropium-albuterol  3 mL Nebulization BID   melatonin  6 mg Oral QHS   memantine  10 mg Oral BID   risperiDONE  2 mg Oral QHS   sertraline  200 mg Oral q AM   traZODone  50 mg Oral QHS   Continuous Infusions:  cefTRIAXone (ROCEPHIN)  IV 1 g (01/18/22 0929)     LOS: 6 days   Time spent= 35 mins    Jeyren Danowski Joline Maxcyhirag Sylvester Minton, MD [facility]  If 7PM-7AM, please contact night-coverage  01/18/2022, 10:25 AM

## 2022-01-19 ENCOUNTER — Inpatient Hospital Stay (HOSPITAL_COMMUNITY): Payer: Medicare HMO

## 2022-01-19 DIAGNOSIS — J942 Hemothorax: Secondary | ICD-10-CM | POA: Diagnosis not present

## 2022-01-19 LAB — URINE CULTURE: Culture: >=100000 — AB

## 2022-01-19 LAB — BASIC METABOLIC PANEL
Anion gap: 11 (ref 5–15)
BUN: 25 mg/dL — ABNORMAL HIGH (ref 8–23)
CO2: 30 mmol/L (ref 22–32)
Calcium: 8.9 mg/dL (ref 8.9–10.3)
Chloride: 101 mmol/L (ref 98–111)
Creatinine, Ser: 1.14 mg/dL — ABNORMAL HIGH (ref 0.44–1.00)
GFR, Estimated: 49 mL/min — ABNORMAL LOW (ref >60)
Glucose, Bld: 121 mg/dL — ABNORMAL HIGH (ref 70–99)
Potassium: 3.3 mmol/L — ABNORMAL LOW (ref 3.5–5.1)
Sodium: 142 mmol/L (ref 135–145)

## 2022-01-19 LAB — CBC
HCT: 31.3 % — ABNORMAL LOW (ref 36.0–46.0)
Hemoglobin: 9.4 g/dL — ABNORMAL LOW (ref 12.0–15.0)
MCH: 27.5 pg (ref 26.0–34.0)
MCHC: 30 g/dL (ref 30.0–36.0)
MCV: 91.5 fL (ref 80.0–100.0)
Platelets: 348 10*3/uL (ref 150–400)
RBC: 3.42 MIL/uL — ABNORMAL LOW (ref 3.87–5.11)
RDW: 16.5 % — ABNORMAL HIGH (ref 11.5–15.5)
WBC: 8.5 10*3/uL (ref 4.0–10.5)
nRBC: 0 % (ref 0.0–0.2)

## 2022-01-19 LAB — GLUCOSE, CAPILLARY
Glucose-Capillary: 100 mg/dL — ABNORMAL HIGH (ref 70–99)
Glucose-Capillary: 129 mg/dL — ABNORMAL HIGH (ref 70–99)
Glucose-Capillary: 82 mg/dL (ref 70–99)
Glucose-Capillary: 91 mg/dL (ref 70–99)
Glucose-Capillary: 91 mg/dL (ref 70–99)

## 2022-01-19 LAB — BRAIN NATRIURETIC PEPTIDE: B Natriuretic Peptide: 114.2 pg/mL — ABNORMAL HIGH (ref 0.0–100.0)

## 2022-01-19 LAB — MAGNESIUM: Magnesium: 2.2 mg/dL (ref 1.7–2.4)

## 2022-01-19 MED ORDER — POTASSIUM CHLORIDE 20 MEQ PO PACK
40.0000 meq | PACK | Freq: Once | ORAL | Status: AC
  Administered 2022-01-19: 40 meq via ORAL
  Filled 2022-01-19: qty 2

## 2022-01-19 MED ORDER — DM-GUAIFENESIN ER 30-600 MG PO TB12
1.0000 | ORAL_TABLET | Freq: Two times a day (BID) | ORAL | Status: DC
  Administered 2022-01-19 – 2022-01-25 (×12): 1 via ORAL
  Filled 2022-01-19 (×12): qty 1

## 2022-01-19 MED ORDER — IPRATROPIUM-ALBUTEROL 0.5-2.5 (3) MG/3ML IN SOLN
3.0000 mL | Freq: Three times a day (TID) | RESPIRATORY_TRACT | Status: DC
  Administered 2022-01-19 – 2022-01-20 (×3): 3 mL via RESPIRATORY_TRACT
  Filled 2022-01-19 (×3): qty 3

## 2022-01-19 NOTE — Progress Notes (Addendum)
PROGRESS NOTE    Diana Horton  XLK:[id number] DOB: 04/19/1940 DOA: 01/11/2022 PCP: Pcp, No   Brief Narrative:  81 year old female with history of dementia, osteoporosis, B12 deficiency admitted for fall from [facility] memory care unit.  Her fall was unwitnessed and unfortunately sustained another fall while in the hospital on 12/8.  She was found to have multiple acute right-sided rib fracture with hemothorax.  EDP discussed case with trauma surgery who recommended conservative management at this time and did not require any chest tube.  Off-and-on during hospitalization patient has had increasing agitation requiring Haldol, Zyprexa and Xanax.  Eventually UA eventually UA was suggestive of urinary tract infection therefore started on empiric IV Rocephin.  Cultures eventually grew E. coli which was sensitive to Rocephin.  Patient was coughing up therefore repeat chest x-ray performed which showed right-sided  opacification   Assessment & Plan:  Principal Problem:   Hemothorax, right Active Problems:   Hemothorax on right   Protein-calorie malnutrition, severe   Multiple acute right rib fracture. Subacute/chronic right-sided hemothorax-traumatic History of osteoporosis  These are pathologic fracture, underwent thoracentesis.  Appears to have complex fibrinous exudate.  EDP consulted with trauma surgery who did not think patient will require chest tube.  Pain control and conservative measures.  Status post thoracentesis on 12/18, 150 cc of bloody effusion was removed.  Abnormal chest x-ray, right-sided complete opacification - Combination of fluid/possible hemothorax given her history and mucous plugging causing atelectasis/compression/obstruction.  Received IV Lasix overnight X2 doses.  Renal function trended up a little bit, with poor oral intake I would be hesitant to give her more for now.  I have discussed case with pulmonary who will assist.  Wonder if she needs repeat  thoracentesis versus bronchoscopy.  For now we will also add bronchodilators Hb is stable.    History of recurrent fall. X-ray hip, CT head, CT chest, CT C-spine and CT abdomen and pelvis were performed.  Only showing acute rib fracture, stable T7 fracture.  No other obvious evidence of acute trauma.  Urinary tract infection - Secondary to E. coli, cultures are sensitive to Rocephin.  Will continue total 5-day course   History of severe dementia. Intermittent agitation Off-and-on agitation.  She is at risk of getting out of bed and falling.  EKG shows normal QTc, as needed Zyprexa, also received as needed Haldol.  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 Recommendation is for semiannual imaging. Monitor.   Goals of care conversation. Son confirms DNR   Given that there is a risk for hemothorax currently holding off on chemical DVT prophylaxis   Adult failure to thrive. Severe protein calorie malnutrition Encourage oral intake.  Dietitian consulted  DVT prophylaxis: SCDs Start: 01/11/22 1816 Place TED hose Start: 01/11/22 1816 Code Status: DNR Family Communication: [name] updated  Status is: Inpatient Remains inpatient appropriate because: Currently agitated requiring medication to help this. On going eval for abnormal CXR. Needs safe    Subjective: Feels ok, resting.   Examination: Constitutional: NAD drowsy.  Cachectic frail.  Appears chronically ill Respiratory: right diminished BS Cardiovascular: Normal sinus rhythm, no rubs Abdomen: Nontender nondistended good bowel sounds Musculoskeletal: No edema noted Skin: No rashes seen Neurologic: Exam is nonfocal Psychiatric: Alert to name only. In restraints b/l UE   Objective: Vitals:   01/18/22 1955 01/18/22 2120 01/19/22 0456 01/19/22 0851  BP: 96/66  (!) 143/98  Pulse: 86  71   Resp:  16  14   Temp: 97.7 F (36.5 C)  (!) 97.5 F (36.4 C)   TempSrc: Oral  Oral   SpO2: 100% 97% 92% 94%  Weight:      Height:        Intake/Output Summary (Last 24 hours) at 01/19/2022 0946 Last data filed at 01/19/2022 5956 Gross per 24 hour  Intake 120 ml  Output 2250 ml  Net -2130 ml    Filed Weights   01/13/22 1851  Weight: 45 kg     Data Reviewed:   CBC: Recent Labs  Lab 01/15/22 0735 01/16/22 0545 01/17/22 0744 01/18/22 0632 01/19/22 0719  WBC 9.6 10.9* 8.7 8.3 8.5  HGB 9.2* 9.6* 10.0* 9.2* 9.4*  HCT 30.1* 31.7* 33.0* 30.8* 31.3*  MCV 90.7 91.1 90.9 91.4 91.5  PLT 357 380 376 375 348   Basic Metabolic Panel: Recent Labs  Lab 01/15/22 0735 01/16/22 0545 01/17/22 0744 01/18/22 0632 01/19/22 0719  NA 135 142 138 142 142  K 3.7 4.0 3.4* 4.3 3.3*  CL 102 107 102 107 101  CO2 26 26 27 28 30   GLUCOSE 106* 107* 112* 109* 121*  BUN 19 14 16 23  25*  CREATININE 0.84 0.82 0.90 0.93 1.14*  CALCIUM 8.4* 8.8* 8.9 8.9 8.9  MG 2.1 2.3 2.2 2.2 2.2   GFR: Estimated Creatinine Clearance: 28 mL/min (A) (by C-G formula based on SCr of 1.14 mg/dL (H)). Liver Function Tests: No results for input(s): "AST", "ALT", "ALKPHOS", "BILITOT", "PROT", "ALBUMIN" in the last 168 hours.  No results for input(s): "LIPASE", "AMYLASE" in the last 168 hours. No results for input(s): "AMMONIA" in the last 168 hours. Coagulation Profile: No results for input(s): "INR", "PROTIME" in the last 168 hours. Cardiac Enzymes: No results for input(s): "CKTOTAL", "CKMB", "CKMBINDEX", "TROPONINI" in the last 168 hours. BNP (last 3 results) No results for input(s): "PROBNP" in the last 8760 hours. HbA1C: No results for input(s): "HGBA1C" in the last 72 hours. CBG: Recent Labs  Lab 01/18/22 0631 01/18/22 1410 01/18/22 1854 01/19/22 0001 01/19/22 0458  GLUCAP 107* 85 125* 129* 82   Lipid Profile: No results for input(s): "CHOL", "HDL", "LDLCALC", "TRIG", "CHOLHDL", "LDLDIRECT" in the  last 72 hours. Thyroid Function Tests: No results for input(s): "TSH", "T4TOTAL", "FREET4", "T3FREE", "THYROIDAB" in the last 72 hours.  Anemia Panel: No results for input(s): "VITAMINB12", "FOLATE", "FERRITIN", "TIBC", "IRON", "RETICCTPCT" in the last 72 hours.  Sepsis Labs: No results for input(s): "PROCALCITON", "LATICACIDVEN" in the last 168 hours.   Recent Results (from the past 240 hour(s))  Culture, blood (routine x 2)     Status: None   Collection Time: 01/11/22  1:36 PM   Specimen: BLOOD  Result Value Ref Range Status   Specimen Description   Final    BLOOD LEFT ANTECUBITAL Performed at [facility], [address], [place], [place]    Special Requests   Final    BOTTLES DRAWN AEROBIC AND ANAEROBIC Blood Culture results may not be optimal due to an inadequate volume of blood received in culture bottles Performed at [facility], [address], [place], [place]    Culture   Final    NO GROWTH 5 DAYS Performed at [facility], [address], [place], [address] [place]    Report Status 01/16/2022 FINAL  Final  Resp panel by RT-PCR (RSV, Flu A&B, Covid) Anterior Nasal Swab  Status: None   Collection Time: 01/11/22  1:36 PM   Specimen: Anterior Nasal Swab  Result Value Ref Range Status   SARS Coronavirus 2 by RT PCR NEGATIVE NEGATIVE Final    Comment: (NOTE) SARS-CoV-2 target nucleic acids are NOT DETECTED.  The SARS-CoV-2 RNA is generally detectable in upper respiratory specimens during the acute phase of infection. The lowest concentration of SARS-CoV-2 viral copies this assay can detect is 138 copies/mL. A negative result does not preclude SARS-Cov-2 infection and should not be used as the sole basis for treatment or other patient management decisions. A negative result may occur with  improper specimen collection/handling, submission of specimen other than nasopharyngeal swab, presence of viral  mutation(s) within the areas targeted by this assay, and inadequate number of viral copies(<138 copies/mL). A negative result must be combined with clinical observations, patient history, and epidemiological information. The expected result is Negative.  Fact Sheet for Patients:  BloggerCourse.comhttps://www.fda.gov/media/152166/download  Fact Sheet for Healthcare Providers:  SeriousBroker.ithttps://www.fda.gov/media/152162/download  This test is no t yet approved or cleared by the [place] FDA and  has been authorized for detection and/or diagnosis of SARS-CoV-2 by FDA under an Emergency Use Authorization (EUA). This EUA will remain  in effect (meaning this test can be used) for the duration of the COVID-19 declaration under Section 564(b)(1) of the Act, 21 U.S.C.section 360bbb-3(b)(1), unless the authorization is terminated  or revoked sooner.       Influenza A by PCR NEGATIVE NEGATIVE Final   Influenza B by PCR NEGATIVE NEGATIVE Final    Comment: (NOTE) The Xpert Xpress SARS-CoV-2/FLU/RSV plus assay is intended as an aid in the diagnosis of influenza from Nasopharyngeal swab specimens and should not be used as a sole basis for treatment. Nasal washings and aspirates are unacceptable for Xpert Xpress SARS-CoV-2/FLU/RSV testing.  Fact Sheet for Patients: BloggerCourse.comhttps://www.fda.gov/media/152166/download  Fact Sheet for Healthcare Providers: SeriousBroker.ithttps://www.fda.gov/media/152162/download  This test is not yet approved or cleared by the [place] FDA and has been authorized for detection and/or diagnosis of SARS-CoV-2 by FDA under an Emergency Use Authorization (EUA). This EUA will remain in effect (meaning this test can be used) for the duration of the COVID-19 declaration under Section 564(b)(1) of the Act, 21 U.S.C. section 360bbb-3(b)(1), unless the authorization is terminated or revoked.     Resp Syncytial Virus by PCR NEGATIVE NEGATIVE Final    Comment: (NOTE) Fact Sheet for  Patients: BloggerCourse.comhttps://www.fda.gov/media/152166/download  Fact Sheet for Healthcare Providers: SeriousBroker.ithttps://www.fda.gov/media/152162/download  This test is not yet approved or cleared by the [place] FDA and has been authorized for detection and/or diagnosis of SARS-CoV-2 by FDA under an Emergency Use Authorization (EUA). This EUA will remain in effect (meaning this test can be used) for the duration of the COVID-19 declaration under Section 564(b)(1) of the Act, 21 U.S.C. section 360bbb-3(b)(1), unless the authorization is terminated or revoked.  Performed at [facility], [address] [place], [place] [address]   Culture, blood (routine x 2)     Status: None   Collection Time: 01/11/22  1:41 PM   Specimen: BLOOD  Result Value Ref Range Status   Specimen Description   Final    BLOOD LEFT ANTECUBITAL Performed at [facility], [address] [place], [place] [address]    Special Requests   Final    BOTTLES DRAWN AEROBIC AND ANAEROBIC Blood Culture adequate volume Performed at [facility], [address] [place], [place] [address]    Culture  Final    NO GROWTH 5 DAYS Performed at [facility], [address], [place], [place] [address]    Report Status 01/16/2022 FINAL  Final  Urine Culture     Status: Abnormal   Collection Time: 01/17/22 12:46 PM   Specimen: Urine, Clean Catch  Result Value Ref Range Status   Specimen Description   Final    URINE, CLEAN CATCH Performed at [facility], [address] [place], [place] [address]    Special Requests   Final    NONE Performed at [facility], [address], [place], [place] [address]    Culture >=100,000 COLONIES/mL ESCHERICHIA COLI (A)  Final   Report Status 01/19/2022 FINAL  Final   Organism ID, Bacteria ESCHERICHIA COLI (A)  Final      Susceptibility   Escherichia coli - MIC*    AMPICILLIN >=32 RESISTANT  Resistant     CEFAZOLIN <=4 SENSITIVE Sensitive     CEFEPIME <=0.12 SENSITIVE Sensitive     CEFTRIAXONE <=0.25 SENSITIVE Sensitive     CIPROFLOXACIN >=4 RESISTANT Resistant     GENTAMICIN <=1 SENSITIVE Sensitive     IMIPENEM <=0.25 SENSITIVE Sensitive     NITROFURANTOIN <=16 SENSITIVE Sensitive     TRIMETH/SULFA <=20 SENSITIVE Sensitive     AMPICILLIN/SULBACTAM 16 INTERMEDIATE Intermediate     PIP/TAZO <=4 SENSITIVE Sensitive     * >=100,000 COLONIES/mL ESCHERICHIA COLI         Radiology Studies: DG Chest Port 1 View  Result Date: 01/19/2022 CLINICAL DATA:  Encounter for dyspnea EXAM: PORTABLE CHEST 1 VIEW COMPARISON:  Radiograph 01/18/2022, CT 01/11/2022 FINDINGS: Unchanged complete opacification of the right hemithorax with multiple displaced right-sided rib fractures. Left basilar subsegmental atelectasis. No new airspace disease in the left lung. No evidence of pneumothorax. Prior left humerus ORIF. Unchanged cardiomediastinal silhouette. IMPRESSION: Unchanged complete opacification of the right hemithorax due to large effusion and adjacent lung atelectasis. A component of mucous plugging is possible. Unchanged multiple right-sided rib fractures. Electronically Signed   By: Caprice Renshaw M.D.   On: 01/19/2022 08:35   DG Chest Port 1 View  Result Date: 01/18/2022 CLINICAL DATA:  Shortness of breath, recent fall with multiple right rib fractures and hemothorax EXAM: PORTABLE CHEST 1 VIEW COMPARISON:  Previous studies including the examination done on 01/12/2022 FINDINGS: There is interval worsening of aeration in right lung with complete opacification. Central pulmonary vessels in left lung appear prominent. Increased markings are seen in left parahilar region and left lower lung field. There is no pneumothorax. Recent fractures are seen in right foot, fifth, sixth, seventh and eighth ribs. There is significant displacement of fracture fragments in right eighth rib. IMPRESSION: There is  interval complete opacification of right hemithorax suggesting increase in amount of right pleural effusion and complete atelectasis of right lung. Possibility of obstruction caused by mucous plugs should be considered. There are patchy infiltrates in left parahilar region and left lower lung fields suggesting multifocal atelectasis/pneumonia. Possible small left pleural effusion. There is no pneumothorax. Multiple recent right rib fractures. These results will be called to the ordering clinician or representative by the Radiologist Assistant, and communication documented in the PACS or [employer]. Electronically Signed   By: Ernie Avena M.D.   On: 01/18/2022 18:27        Scheduled Meds:  dextrose  25 g Intravenous Once   feeding supplement  237 mL Oral BID BM   ipratropium-albuterol  3 mL Nebulization  BID   melatonin  6 mg Oral QHS   memantine  10 mg Oral BID   risperiDONE  2 mg Oral QHS   sertraline  200 mg Oral q AM   traZODone  50 mg Oral QHS   Continuous Infusions:  cefTRIAXone (ROCEPHIN)  IV 1 g (01/19/22 0928)     LOS: 7 days   Time spent= 35 mins    Bryanda Mikel Joline Maxcy, MD [facility]  If 7PM-7AM, please contact night-coverage  01/19/2022, 9:46 AM

## 2022-01-19 NOTE — Consult Note (Signed)
NAME:  SAMREEN DEBARTOLO, MRN:  [id number], DOB:  [birth year], LOS: 7 ADMISSION DATE:  01/11/2022, CONSULTATION DATE:  01/19/22 REFERRING MD:  Dr. Reesa Chew, CHIEF COMPLAINT:  abnormal CXR   History of Present Illness:  81 year old severe dementia we are consulted for right lung whiteout.  [place] about a week ago.  Large pleural effusion on the right with broken ribs time of admission CT.  Persistent moderate size pleural effusion on chest x-ray.  IR was consulted.  Only 150 cc of bloody fluid removed.  Presumed hemothorax.  No reason they stopped.  No further follow-up.  Yesterday, she had cough.  This prompted chest x-ray.  This demonstrated right hemothorax with opacification.  She is pleasantly demented.  Denies significant breathing issues.  Satting high 90s on 2 L nasal cannula.  Bedside ultrasound reveals mildly complex right-sided moderate effusion with underlying atelectasis of the lung tissue.  Pertinent  Medical History  Severe dementia  Significant Hospital Events: Including procedures, antibiotic start and stop dates in addition to other pertinent events   12/17 admitted with fall, chest x-ray with right-sided effusion 12/18 thoracentesis with IR, 150 cc bloody fluid removed, unclear why so little 12/25 PCCM consult after chest x-ray obtained 12/24 for cough revealed left lung whiteout  Interim History / Subjective:    Objective   Blood pressure 107/76, pulse 79, temperature (!) 97.3 F (36.3 C), temperature source Oral, resp. rate 18, height 5' 7.5" (1.715 m), weight 45 kg, SpO2 95 %.        Intake/Output Summary (Last 24 hours) at 01/19/2022 1609 Last data filed at 01/19/2022 1112 Gross per 24 hour  Intake 120 ml  Output 2800 ml  Net -2680 ml   Filed Weights   01/13/22 1851  Weight: 45 kg    Examination: General: Very thin, chronically ill-appearing HENT: Moist mucous membranes, no icterus Lungs: Diminished on right base but moving little bit of air in right  upper, clear on left Cardiovascular: Warm, no edema Abdomen: Bowel sounds present MSK: No synovitis, no joint effusion Neuro: Extremities, sensation appears intact Psych: Alert, interactive, inattentive  Resolved Hospital Problem list     Assessment & Plan:  Right lung whiteout: Likely multifactorial.  After fall had right-sided what appeared to be partially loculated effusion.  Tap by IR but only 150 cc removed 01/12/2022.  There is no discussion why only such a small amount was removed, imaging indicates at least small to moderate effusion would expect more.  Says was bloody.  Possible hemothorax.  On chest x-ray, I see no sign of the left mainstem bronchus, suspect there is mucous plugging.  On bedside ultrasound there is a moderate to large complicated effusion on the right.  I suspect lung imaging reflects both effusion and atelectasis, compressive and mucous plugging.  Fortunately, patient is in no distress.  No increased work of breathing or tachypnea.  Mild hypoxemia satting high 90s on 2 L. -- Given IR has already intervened in the space this admission, recommend IR reevaluation for chest tube placement given complicated appearance on ultrasound versus repeat [place] versus pulmonary placing chest tube versus repeat thoracentesis, reconvene in the morning  Best Practice (right click and "Reselect all SmartList Selections" daily)   Per primary  Labs   CBC: Recent Labs  Lab 01/15/22 0735 01/16/22 0545 01/17/22 0744 01/18/22 0632 01/19/22 0719  WBC 9.6 10.9* 8.7 8.3 8.5  HGB 9.2* 9.6* 10.0* 9.2* 9.4*  HCT 30.1* 31.7* 33.0* 30.8* 31.3*  MCV 90.7 91.1  90.9 91.4 91.5  PLT 357 380 376 375 0000000    Basic Metabolic Panel: Recent Labs  Lab 01/15/22 0735 01/16/22 0545 01/17/22 0744 01/18/22 0632 01/19/22 0719  NA 135 142 138 142 142  K 3.7 4.0 3.4* 4.3 3.3*  CL 102 107 102 107 101  CO2 26 26 27 28 30   GLUCOSE 106* 107* 112* 109* 121*  BUN 19 14 16 23  25*  CREATININE 0.84  0.82 0.90 0.93 1.14*  CALCIUM 8.4* 8.8* 8.9 8.9 8.9  MG 2.1 2.3 2.2 2.2 2.2   GFR: Estimated Creatinine Clearance: 28 mL/min (A) (by C-G formula based on SCr of 1.14 mg/dL (H)). Recent Labs  Lab 01/16/22 0545 01/17/22 0744 01/18/22 0632 01/19/22 0719  WBC 10.9* 8.7 8.3 8.5    Liver Function Tests: No results for input(s): "AST", "ALT", "ALKPHOS", "BILITOT", "PROT", "ALBUMIN" in the last 168 hours. No results for input(s): "LIPASE", "AMYLASE" in the last 168 hours. No results for input(s): "AMMONIA" in the last 168 hours.  ABG    Component Value Date/Time   TCO2 23 11/02/2021 0322     Coagulation Profile: No results for input(s): "INR", "PROTIME" in the last 168 hours.  Cardiac Enzymes: No results for input(s): "CKTOTAL", "CKMB", "CKMBINDEX", "TROPONINI" in the last 168 hours.  HbA1C: No results found for: "HGBA1C"  CBG: Recent Labs  Lab 01/18/22 1410 01/18/22 1854 01/19/22 0001 01/19/22 0458 01/19/22 1246  GLUCAP 85 125* 129* 82 100*    Review of Systems:   Unable to obtain due to dementia  Past Medical History:  She,  has a past medical history of Dementia ([place]).   Surgical History:  History reviewed. No pertinent surgical history.   Social History:   reports that she has an unknown smoking status. She has never used smokeless tobacco.   Family History:  Her family history is not on file.   Allergies No Known Allergies   Home Medications  Prior to Admission medications   Medication Sig Start Date End Date Taking? Authorizing Provider  alendronate (FOSAMAX) 70 MG tablet Take 70 mg by mouth once a week. 05/15/20  Yes [provider]  Cholecalciferol (VITAMIN D3) 1000 units CAPS Take 1,000 Units by mouth daily.   Yes [provider]  cyanocobalamin 1000 MCG tablet Take 1,000 mcg by mouth daily. 05/09/19  Yes [provider]  folic acid (FOLVITE) 1 MG tablet Take 2 mg by mouth daily. 01/16/20  Yes [provider]   LORazepam (ATIVAN) 0.5 MG tablet Take 0.5 mg by mouth 2 (two) times daily as needed for anxiety (or agitation). 05/26/21  Yes [provider]  melatonin 3 MG TABS tablet Take 6 mg by mouth at bedtime.   Yes [provider]  memantine (NAMENDA) 10 MG tablet Take 10 mg by mouth 2 (two) times daily.   Yes [provider]  potassium chloride (KLOR-CON) 10 MEQ tablet Take 10 mEq by mouth daily.   Yes [provider]  sertraline (ZOLOFT) 100 MG tablet Take 200 mg by mouth in the morning.   Yes [provider]  traZODone (DESYREL) 50 MG tablet Take 25 mg by mouth at bedtime. 06/09/20  Yes [provider]  memantine (NAMENDA) 5 MG tablet Take by mouth. Patient not taking: Reported on 01/11/2022 03/31/21   [provider]  potassium chloride SA (KLOR-CON M) 20 MEQ tablet Take 1 tablet (20 mEq total) by mouth daily for 5 days. Patient not taking: Reported on 01/11/2022 09/13/21 01/11/22  Mardene Sayer, MD     Critical care time:     Karren Burly, MD See [facility] for contact info

## 2022-01-20 ENCOUNTER — Inpatient Hospital Stay (HOSPITAL_COMMUNITY): Payer: Medicare HMO

## 2022-01-20 DIAGNOSIS — S2241XA Multiple fractures of ribs, right side, initial encounter for closed fracture: Secondary | ICD-10-CM

## 2022-01-20 DIAGNOSIS — W19XXXA Unspecified fall, initial encounter: Secondary | ICD-10-CM

## 2022-01-20 DIAGNOSIS — J942 Hemothorax: Secondary | ICD-10-CM | POA: Diagnosis not present

## 2022-01-20 LAB — GLUCOSE, CAPILLARY
Glucose-Capillary: 54 mg/dL — ABNORMAL LOW (ref 70–99)
Glucose-Capillary: 57 mg/dL — ABNORMAL LOW (ref 70–99)
Glucose-Capillary: 71 mg/dL (ref 70–99)
Glucose-Capillary: 85 mg/dL (ref 70–99)
Glucose-Capillary: 88 mg/dL (ref 70–99)
Glucose-Capillary: 94 mg/dL (ref 70–99)

## 2022-01-20 LAB — BODY FLUID CELL COUNT WITH DIFFERENTIAL
Eos, Fluid: 1 %
Lymphs, Fluid: 16 %
Monocyte-Macrophage-Serous Fluid: 64 % (ref 50–90)
Neutrophil Count, Fluid: 19 % (ref 0–25)
Total Nucleated Cell Count, Fluid: 769 cu mm (ref 0–1000)

## 2022-01-20 LAB — CBC
HCT: 31 % — ABNORMAL LOW (ref 36.0–46.0)
Hemoglobin: 9.2 g/dL — ABNORMAL LOW (ref 12.0–15.0)
MCH: 26.7 pg (ref 26.0–34.0)
MCHC: 29.7 g/dL — ABNORMAL LOW (ref 30.0–36.0)
MCV: 89.9 fL (ref 80.0–100.0)
Platelets: 354 10*3/uL (ref 150–400)
RBC: 3.45 MIL/uL — ABNORMAL LOW (ref 3.87–5.11)
RDW: 16.5 % — ABNORMAL HIGH (ref 11.5–15.5)
WBC: 7.4 10*3/uL (ref 4.0–10.5)
nRBC: 0 % (ref 0.0–0.2)

## 2022-01-20 LAB — BASIC METABOLIC PANEL
Anion gap: 9 (ref 5–15)
BUN: 23 mg/dL (ref 8–23)
CO2: 29 mmol/L (ref 22–32)
Calcium: 9 mg/dL (ref 8.9–10.3)
Chloride: 101 mmol/L (ref 98–111)
Creatinine, Ser: 0.9 mg/dL (ref 0.44–1.00)
GFR, Estimated: >60 mL/min (ref >60)
Glucose, Bld: 95 mg/dL (ref 70–99)
Potassium: 3.4 mmol/L — ABNORMAL LOW (ref 3.5–5.1)
Sodium: 139 mmol/L (ref 135–145)

## 2022-01-20 LAB — MAGNESIUM: Magnesium: 2.1 mg/dL (ref 1.7–2.4)

## 2022-01-20 MED ORDER — DEXTROSE 50 % IV SOLN: 25.0000 g | INTRAVENOUS | Status: DC | PRN

## 2022-01-20 MED ORDER — LIDOCAINE HCL 1 % IJ SOLN
INTRAMUSCULAR | Status: AC
  Filled 2022-01-20: qty 20

## 2022-01-20 MED ORDER — GLUCAGON HCL RDNA (DIAGNOSTIC) 1 MG IJ SOLR: 1.0000 mg | Freq: Once | INTRAMUSCULAR | Status: DC | PRN

## 2022-01-20 NOTE — Procedures (Signed)
Ultrasound-guided diagnostic and therapeutic right thoracentesis performed yielding 1 liter of dark bloody fluid. No immediate complications. Follow-up chest x-ray pending. A portion of the fluid was sent to the lab for preordered studies. Due to pt agitation only the above amount of fluid was removed today.

## 2022-01-20 NOTE — Progress Notes (Signed)
NAME:  Diana Horton, MRN:  [id number], DOB:  [birth year], LOS: 8 ADMISSION DATE:  01/11/2022, CONSULTATION DATE:  01/20/22 REFERRING MD:  Dr. Nelson Chimes, CHIEF COMPLAINT:  abnormal CXR   History of Present Illness:  81 year old severe dementia we are consulted for right lung whiteout.  Larey Seat about a week ago.  Large pleural effusion on the right with broken ribs time of admission CT.  Persistent moderate size pleural effusion on chest x-ray.  IR was consulted.  Only 150 cc of bloody fluid removed.  Presumed hemothorax.  No reason they stopped.  No further follow-up.  Yesterday, she had cough.  This prompted chest x-ray.  This demonstrated right hemothorax with opacification.  She is pleasantly demented.  Denies significant breathing issues.  Satting high 90s on 2 L nasal cannula.  Bedside ultrasound reveals mildly complex right-sided moderate effusion with underlying atelectasis of the lung tissue.  Pertinent  Medical History   Severe dementia  Significant Hospital Events: Including procedures, antibiotic start and stop dates in addition to other pertinent events   12/17 admitted with fall, chest x-ray with right-sided effusion 12/18 thoracentesis with IR, 150 cc bloody fluid removed, unclear why so little 12/25 PCCM consult after chest x-ray obtained 12/24 for cough revealed left lung whiteout 12/26 seen today in follow-up, comfortable no distress.  Interim History / Subjective:   Smiling, comfortable no distress  Objective   Blood pressure (!) 135/93, pulse (!) 110, temperature 98 F (36.7 C), temperature source Oral, resp. rate 20, height 5' 7.5" (1.715 m), weight 45 kg, SpO2 91 %.        Intake/Output Summary (Last 24 hours) at 01/20/2022 1351 Last data filed at 01/20/2022 8657 Gross per 24 hour  Intake 240 ml  Output 600 ml  Net -360 ml   Filed Weights   01/13/22 1851  Weight: 45 kg    Examination: General: Very thin, frail chronically ill-appearing debilitated  lady HENT: Mucous membranes dry Lungs: Severely diminished breath sounds, near absent on the right compared to the left Cardiovascular: Regular rate rhythm, S1-S2 Abdomen: Soft nontender nondistended Neuro: Alert following commands Psych: Seems pleasant, no distress.  Resolved Hospital Problem list     Assessment & Plan:   Right lung collapse Likely obstructed, mucous plugging, hypoventilation in the setting of recent fall, multiple right-sided rib fractures. Small component of loculated fluid, likely owed hemothorax from recent fall and rib fractures. Plan:  I called spoke with the patient's son via phone. I explained that bronchoscopy and clearance of the right mainstem would help alleviate her problem short-term but in the long-term setting I suspect this is going to be a recurrent issue. [name], was very clear on the phone that the patient's will states that she would never want aggressive interventions to include procedures or placed on mechanical life support. I did explain there was not really any safe way to do a bronchoscopy on his mother without being placed on mechanical support. He would not want her to go through this especially if it is a situation where is likely going to continue to recur.  We talked about the fact that his mother is currently bedbound, does not walk, if she does she has fallen at least 5 times in the past few weeks in her memory care unit.  We talked about the patient's end-of-life goals.  We settled that she would remain a DNR.  We would continue to support her with medications.  We would have no aggressive measures or interventions.  Our goal at this point would be to remain and keep her comfortable.  I did explain that this was a normal process to go through.  He was thankful for the call.  I called and updated her primary physician Dr. Nelson Chimes.   Pulmonary will sign off at this time.  Please do not hesitate to give [place] a call with any questions or  concerns.  Best Practice (right click and "Reselect all SmartList Selections" daily)   Per primary  Labs   CBC: Recent Labs  Lab 01/16/22 0545 01/17/22 0744 01/18/22 0632 01/19/22 0719 01/20/22 0557  WBC 10.9* 8.7 8.3 8.5 7.4  HGB 9.6* 10.0* 9.2* 9.4* 9.2*  HCT 31.7* 33.0* 30.8* 31.3* 31.0*  MCV 91.1 90.9 91.4 91.5 89.9  PLT 380 376 375 348 354    Basic Metabolic Panel: Recent Labs  Lab 01/16/22 0545 01/17/22 0744 01/18/22 0632 01/19/22 0719 01/20/22 0557  NA 142 138 142 142 139  K 4.0 3.4* 4.3 3.3* 3.4*  CL 107 102 107 101 101  CO2 26 27 28 30 29   GLUCOSE 107* 112* 109* 121* 95  BUN 14 16 23  25* 23  CREATININE 0.82 0.90 0.93 1.14* 0.90  CALCIUM 8.8* 8.9 8.9 8.9 9.0  MG 2.3 2.2 2.2 2.2 2.1   GFR: Estimated Creatinine Clearance: 35.4 mL/min (by C-G formula based on SCr of 0.9 mg/dL). Recent Labs  Lab 01/17/22 0744 01/18/22 0632 01/19/22 0719 01/20/22 0557  WBC 8.7 8.3 8.5 7.4    Liver Function Tests: No results for input(s): "AST", "ALT", "ALKPHOS", "BILITOT", "PROT", "ALBUMIN" in the last 168 hours. No results for input(s): "LIPASE", "AMYLASE" in the last 168 hours. No results for input(s): "AMMONIA" in the last 168 hours.  ABG    Component Value Date/Time   TCO2 23 11/02/2021 0322     Coagulation Profile: No results for input(s): "INR", "PROTIME" in the last 168 hours.  Cardiac Enzymes: No results for input(s): "CKTOTAL", "CKMB", "CKMBINDEX", "TROPONINI" in the last 168 hours.  HbA1C: No results found for: "HGBA1C"  CBG: Recent Labs  Lab 01/19/22 2352 01/20/22 0704 01/20/22 0714 01/20/22 0805 01/20/22 1114  GLUCAP 91 57* 54* 85 94    Review of Systems:   Unable to obtain due to dementia  Past Medical History:  She,  has a past medical history of Dementia (HCC).   Surgical History:  History reviewed. No pertinent surgical history.   Social History:   reports that she has an unknown smoking status. She has never used smokeless  tobacco.   Family History:  Her family history is not on file.   Allergies No Known Allergies   Home Medications  Prior to Admission medications   Medication Sig Start Date End Date Taking? Authorizing Provider  alendronate (FOSAMAX) 70 MG tablet Take 70 mg by mouth once a week. 05/15/20  Yes [provider]  Cholecalciferol (VITAMIN D3) 1000 units CAPS Take 1,000 Units by mouth daily.   Yes [provider]  cyanocobalamin 1000 MCG tablet Take 1,000 mcg by mouth daily. 05/09/19  Yes [provider]  folic acid (FOLVITE) 1 MG tablet Take 2 mg by mouth daily. 01/16/20  Yes [provider]  LORazepam (ATIVAN) 0.5 MG tablet Take 0.5 mg by mouth 2 (two) times daily as needed for anxiety (or agitation). 05/26/21  Yes [provider]  melatonin 3 MG TABS tablet Take 6 mg by mouth at bedtime.   Yes [provider]  memantine (NAMENDA) 10 MG tablet  Take 10 mg by mouth 2 (two) times daily.   Yes [provider]  potassium chloride (KLOR-CON) 10 MEQ tablet Take 10 mEq by mouth daily.   Yes [provider]  sertraline (ZOLOFT) 100 MG tablet Take 200 mg by mouth in the morning.   Yes [provider]  traZODone (DESYREL) 50 MG tablet Take 25 mg by mouth at bedtime. 06/09/20  Yes [provider]  memantine (NAMENDA) 5 MG tablet Take by mouth. Patient not taking: Reported on 01/11/2022 03/31/21   [provider]  potassium chloride SA (KLOR-CON M) 20 MEQ tablet Take 1 tablet (20 mEq total) by mouth daily for 5 days. Patient not taking: Reported on 01/11/2022 09/13/21 01/11/22  Mardene Sayer, MD     Josephine Igo, DO [facility] 01/20/2022 1:52 PM

## 2022-01-20 NOTE — Care Management Important Message (Signed)
Important Message  Patient Details IM Letter placed in Patient's room. Name: Diana Horton MRN: [id number] Date of Birth: August 27, 1940   Medicare Important Message Given:  Yes     Caren Macadam 01/20/2022, 10:22 AM

## 2022-01-20 NOTE — Progress Notes (Signed)
PROGRESS NOTE    Diana Horton  [id number] DOB: Jan 31, 1940 DOA: 01/11/2022 PCP: Pcp, No   Brief Narrative:  81 year old female with history of dementia, osteoporosis, B12 deficiency admitted for fall from [facility] memory care unit.  Her fall was unwitnessed and unfortunately sustained another fall while in the hospital on 12/8.  She was found to have multiple acute right-sided rib fracture with hemothorax.  EDP discussed case with trauma surgery who recommended conservative management at this time and did not require any chest tube.  Off-and-on during hospitalization patient has had increasing agitation requiring Haldol, Zyprexa and Xanax.  Eventually UA eventually UA was suggestive of urinary tract infection therefore started on empiric IV Rocephin.  Cultures eventually grew E. coli which was sensitive to Rocephin.  Patient was coughing up therefore repeat chest x-ray performed which showed right-sided  opacification   Assessment & Plan:  Principal Problem:   Hemothorax, right Active Problems:   Hemothorax on right   Protein-calorie malnutrition, severe   Multiple acute right rib fracture. Subacute/chronic right-sided hemothorax-traumatic History of osteoporosis  These are pathologic fracture, underwent thoracentesis.  Appears to have complex fibrinous exudate.  EDP consulted with trauma surgery who did not think patient will require chest tube.  Pain control and conservative measures.  Status post thoracentesis on 12/18, 150 cc of bloody effusion was removed.  IR consulted again for thoracentesis.  Abnormal chest x-ray, right-sided complete opacification - Combination of fluid/possible hemothorax given her history and mucous plugging causing atelectasis/compression/obstruction.  Despite her getting Lasix she has persistent right-sided opacification of the lungs.  Discussed case with pulmonary and IR.  IR plans on thoracentesis today.  Pulmonary planning on bronchoscopy tomorrow,  made n.p.o. past midnight.   History of recurrent fall. X-ray hip, CT head, CT chest, CT C-spine and CT abdomen and pelvis were performed.  Only showing acute rib fracture, stable T7 fracture.  No other obvious evidence of acute trauma.  Urinary tract infection - Secondary to E. coli, cultures are sensitive to Rocephin.  Complete 5-day course, last day today   History of severe dementia. Intermittent agitation Off-and-on agitation.  She is at risk of getting out of bed and falling.  EKG shows normal QTc, as needed Zyprexa, also received as needed Haldol.  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 Recommendation is for semiannual imaging. Monitor.   Goals of care conversation. Son confirms DNR   Given that there is a risk for hemothorax currently holding off on chemical DVT prophylaxis   Adult failure to thrive. Severe protein calorie malnutrition Encourage oral intake.  Dietitian consulted  DVT prophylaxis: SCDs Start: 01/11/22 1816 Place TED hose Start: 01/11/22 1816 Code Status: DNR Family Communication: Collier Salina updated periodically  Status is: Inpatient Remains inpatient appropriate because plans for thoracentesis today, plans for bronchoscopy tomorrow.  Subjective: Ate all of her breakfast this morning but did have hypoglycemia right before that.  Answers very basic questions but overall very confused  Examination: Constitutional: Not in acute distress.  Cachectic frail with bilateral temporal wasting Respiratory: Clear to auscultation on the left side, no breath sounds heard on the right Cardiovascular: Normal sinus rhythm, no rubs Abdomen: Nontender nondistended good bowel sounds Musculoskeletal: No edema noted Skin: No rashes seen Neurologic: CN 2-12 grossly intact.  And nonfocal Psychiatric: Alert to name only.  Very poor judgment and  insight In restraints b/l UE  Objective: Vitals:   01/19/22 0851 01/19/22 1337 01/19/22 2150 01/20/22 0702  BP:  107/76 (!) 111/91 135/81  Pulse:  79 95 (!) 110  Resp:  18 17 17   Temp:  (!) 97.3 F (36.3 C) 97.8 F (36.6 C) (!) 97.5 F (36.4 C)  TempSrc:  Oral Oral Oral  SpO2: 94% 95%  91%  Weight:      Height:        Intake/Output Summary (Last 24 hours) at 01/20/2022 1103 Last data filed at 01/20/2022 [id number] Gross per 24 hour  Intake 240 ml  Output 1150 ml  Net -910 ml    Filed Weights   01/13/22 1851  Weight: 45 kg     Data Reviewed:   CBC: Recent Labs  Lab 01/16/22 0545 01/17/22 0744 01/18/22 0632 01/19/22 0719 01/20/22 0557  WBC 10.9* 8.7 8.3 8.5 7.4  HGB 9.6* 10.0* 9.2* 9.4* 9.2*  HCT 31.7* 33.0* 30.8* 31.3* 31.0*  MCV 91.1 90.9 91.4 91.5 89.9  PLT 380 376 375 348 [id number]   Basic Metabolic Panel: Recent Labs  Lab 01/16/22 0545 01/17/22 0744 01/18/22 0632 01/19/22 0719 01/20/22 0557  NA 142 138 142 142 139  K 4.0 3.4* 4.3 3.3* 3.4*  CL 107 102 107 101 101  CO2 26 27 28 30 29   GLUCOSE 107* 112* 109* 121* 95  BUN 14 16 23  25* 23  CREATININE 0.82 0.90 0.93 1.14* 0.90  CALCIUM 8.8* 8.9 8.9 8.9 9.0  MG 2.3 2.2 2.2 2.2 2.1   GFR: Estimated Creatinine Clearance: 35.4 mL/min (by C-G formula based on SCr of 0.9 mg/dL). Liver Function Tests: No results for input(s): "AST", "ALT", "ALKPHOS", "BILITOT", "PROT", "ALBUMIN" in the last 168 hours.  No results for input(s): "LIPASE", "AMYLASE" in the last 168 hours. No results for input(s): "AMMONIA" in the last 168 hours. Coagulation Profile: No results for input(s): "INR", "PROTIME" in the last 168 hours. Cardiac Enzymes: No results for input(s): "CKTOTAL", "CKMB", "CKMBINDEX", "TROPONINI" in the last 168 hours. BNP (last 3 results) No results for input(s): "PROBNP" in the last 8760 hours. HbA1C: No results for input(s): "HGBA1C" in the last 72 hours. CBG: Recent Labs  Lab 01/19/22 1911  01/19/22 2352 01/20/22 0704 01/20/22 0714 01/20/22 0805  GLUCAP 91 91 57* 54* 85   Lipid Profile: No results for input(s): "CHOL", "HDL", "LDLCALC", "TRIG", "CHOLHDL", "LDLDIRECT" in the last 72 hours. Thyroid Function Tests: No results for input(s): "TSH", "T4TOTAL", "FREET4", "T3FREE", "THYROIDAB" in the last 72 hours.  Anemia Panel: No results for input(s): "VITAMINB12", "FOLATE", "FERRITIN", "TIBC", "IRON", "RETICCTPCT" in the last 72 hours.  Sepsis Labs: No results for input(s): "PROCALCITON", "LATICACIDVEN" in the last 168 hours.   Recent Results (from the past 240 hour(s))  Culture, blood (routine x 2)     Status: None   Collection Time: 01/11/22  1:36 PM   Specimen: BLOOD  Result Value Ref Range Status   Specimen Description   Final    BLOOD LEFT ANTECUBITAL Performed at [facility] [address] [place], [place] [address]    Special Requests   Final    BOTTLES DRAWN AEROBIC AND ANAEROBIC Blood Culture results may not be optimal due to an inadequate volume of blood received in culture bottles Performed at [facility] [address] [place], [place] [address]    Culture   Final    NO GROWTH 5 DAYS Performed at [facility], [place] [address] [place],  [address]  Report Status 01/16/2022 FINAL  Final  Resp panel by RT-PCR (RSV, Flu A&B, Covid) Anterior Nasal Swab     Status: None   Collection Time: 01/11/22  1:36 PM   Specimen: Anterior Nasal Swab  Result Value Ref Range Status   SARS Coronavirus 2 by RT PCR NEGATIVE NEGATIVE Final    Comment: (NOTE) SARS-CoV-2 target nucleic acids are NOT DETECTED.  The SARS-CoV-2 RNA is generally detectable in upper respiratory specimens during the acute phase of infection. The lowest concentration of SARS-CoV-2 viral copies this assay can detect is 138 copies/mL. A negative result does not preclude SARS-Cov-2 infection and should not be used as the sole basis for  treatment or other patient management decisions. A negative result may occur with  improper specimen collection/handling, submission of specimen other than nasopharyngeal swab, presence of viral mutation(s) within the areas targeted by this assay, and inadequate number of viral copies(<138 copies/mL). A negative result must be combined with clinical observations, patient history, and epidemiological information. The expected result is Negative.  Fact Sheet for Patients:  BloggerCourse.com  Fact Sheet for Healthcare Providers:  SeriousBroker.it  This test is no t yet approved or cleared by the [place] FDA and  has been authorized for detection and/or diagnosis of SARS-CoV-2 by FDA under an Emergency Use Authorization (EUA). This EUA will remain  in effect (meaning this test can be used) for the duration of the COVID-19 declaration under Section 564(b)(1) of the Act, 21 U.S.C.section 360bbb-3(b)(1), unless the authorization is terminated  or revoked sooner.       Influenza A by PCR NEGATIVE NEGATIVE Final   Influenza B by PCR NEGATIVE NEGATIVE Final    Comment: (NOTE) The Xpert Xpress SARS-CoV-2/FLU/RSV plus assay is intended as an aid in the diagnosis of influenza from Nasopharyngeal swab specimens and should not be used as a sole basis for treatment. Nasal washings and aspirates are unacceptable for Xpert Xpress SARS-CoV-2/FLU/RSV testing.  Fact Sheet for Patients: BloggerCourse.com  Fact Sheet for Healthcare Providers: SeriousBroker.it  This test is not yet approved or cleared by the [place] FDA and has been authorized for detection and/or diagnosis of SARS-CoV-2 by FDA under an Emergency Use Authorization (EUA). This EUA will remain in effect (meaning this test can be used) for the duration of the COVID-19 declaration under Section 564(b)(1) of the Act, 21  U.S.C. section 360bbb-3(b)(1), unless the authorization is terminated or revoked.     Resp Syncytial Virus by PCR NEGATIVE NEGATIVE Final    Comment: (NOTE) Fact Sheet for Patients: BloggerCourse.com  Fact Sheet for Healthcare Providers: SeriousBroker.it  This test is not yet approved or cleared by the [place] FDA and has been authorized for detection and/or diagnosis of SARS-CoV-2 by FDA under an Emergency Use Authorization (EUA). This EUA will remain in effect (meaning this test can be used) for the duration of the COVID-19 declaration under Section 564(b)(1) of the Act, 21 U.S.C. section 360bbb-3(b)(1), unless the authorization is terminated or revoked.  Performed at [facility], [address], [place], [place] [address]   Culture, blood (routine x 2)     Status: None   Collection Time: 01/11/22  1:41 PM   Specimen: BLOOD  Result Value Ref Range Status   Specimen Description   Final    BLOOD LEFT ANTECUBITAL Performed at [facility], [address], [place], [place] [address]    Special Requests   Final    BOTTLES DRAWN AEROBIC AND ANAEROBIC Blood  Culture adequate volume Performed at [facility] [address] [place], [place] [address]    Culture   Final    NO GROWTH 5 DAYS Performed at [facility], [place] [address], [place], [place] [address]    Report Status 01/16/2022 FINAL  Final  Urine Culture     Status: Abnormal   Collection Time: 01/17/22 12:46 PM   Specimen: Urine, Clean Catch  Result Value Ref Range Status   Specimen Description   Final    URINE, CLEAN CATCH Performed at [facility], [place] [address] [place], [place] [address]    Special Requests   Final    NONE Performed at [facility], [place] [address], [place], [place] [address]    Culture >=100,000 COLONIES/mL ESCHERICHIA COLI (A)  Final    Report Status 01/19/2022 FINAL  Final   Organism ID, Bacteria ESCHERICHIA COLI (A)  Final      Susceptibility   Escherichia coli - MIC*    AMPICILLIN >=32 RESISTANT Resistant     CEFAZOLIN <=4 SENSITIVE Sensitive     CEFEPIME <=0.12 SENSITIVE Sensitive     CEFTRIAXONE <=0.25 SENSITIVE Sensitive     CIPROFLOXACIN >=4 RESISTANT Resistant     GENTAMICIN <=1 SENSITIVE Sensitive     IMIPENEM <=0.25 SENSITIVE Sensitive     NITROFURANTOIN <=16 SENSITIVE Sensitive     TRIMETH/SULFA <=20 SENSITIVE Sensitive     AMPICILLIN/SULBACTAM 16 INTERMEDIATE Intermediate     PIP/TAZO <=4 SENSITIVE Sensitive     * >=100,000 COLONIES/mL ESCHERICHIA COLI         Radiology Studies: DG Chest Port 1 View  Result Date: 01/20/2022 CLINICAL DATA:  Dyspnea.  Blunt chest trauma with right hemothorax. EXAM: PORTABLE CHEST 1 VIEW COMPARISON:  01/19/2022 FINDINGS: Complete opacification of right hemithorax is again seen with shift of mediastinal structures to the left. This is consistent with a large right hemothorax. No pneumothorax visualized. Multiple right-sided rib fractures are again seen. Mild opacity in left retrocardiac lung base is unchanged, and differential diagnosis includes pulmonary contusion, pneumonia, or aspiration. IMPRESSION: No significant change in large right hemothorax, with mediastinal shift to the left. Multiple right-sided rib fractures. No pneumothorax visualized. Stable mild left retrocardiac opacity, which may be due to pulmonary contusion, pneumonia, or aspiration. Electronically Signed   By: Marlaine Hind M.D.   On: 01/20/2022 10:04   DG Chest Port 1 View  Result Date: 01/19/2022 CLINICAL DATA:  Encounter for dyspnea EXAM: PORTABLE CHEST 1 VIEW COMPARISON:  Radiograph 01/18/2022, CT 01/11/2022 FINDINGS: Unchanged complete opacification of the right hemithorax with multiple displaced right-sided rib fractures. Left basilar subsegmental atelectasis. No new airspace disease in the left  lung. No evidence of pneumothorax. Prior left humerus ORIF. Unchanged cardiomediastinal silhouette. IMPRESSION: Unchanged complete opacification of the right hemithorax due to large effusion and adjacent lung atelectasis. A component of mucous plugging is possible. Unchanged multiple right-sided rib fractures. Electronically Signed   By: Maurine Simmering M.D.   On: 01/19/2022 08:35   DG Chest Port 1 View  Result Date: 01/18/2022 CLINICAL DATA:  Shortness of breath, recent fall with multiple right rib fractures and hemothorax EXAM: PORTABLE CHEST 1 VIEW COMPARISON:  Previous studies including the examination done on 01/12/2022 FINDINGS: There is interval worsening of aeration in right lung with complete opacification. Central pulmonary vessels in left lung appear prominent. Increased markings are seen in left parahilar region and left lower lung field. There is no pneumothorax. Recent fractures are  seen in right foot, fifth, sixth, seventh and eighth ribs. There is significant displacement of fracture fragments in right eighth rib. IMPRESSION: There is interval complete opacification of right hemithorax suggesting increase in amount of right pleural effusion and complete atelectasis of right lung. Possibility of obstruction caused by mucous plugs should be considered. There are patchy infiltrates in left parahilar region and left lower lung fields suggesting multifocal atelectasis/pneumonia. Possible small left pleural effusion. There is no pneumothorax. Multiple recent right rib fractures. These results will be called to the ordering clinician or representative by the Radiologist Assistant, and communication documented in the PACS or [employer]. Electronically Signed   By: Ernie Avena M.D.   On: 01/18/2022 18:27        Scheduled Meds:  dextromethorphan-guaiFENesin  1 tablet Oral BID   feeding supplement  237 mL Oral BID BM   melatonin  6 mg Oral QHS   memantine  10 mg Oral BID   risperiDONE   2 mg Oral QHS   sertraline  200 mg Oral q AM   traZODone  50 mg Oral QHS   Continuous Infusions:  cefTRIAXone (ROCEPHIN)  IV 1 g (01/20/22 0919)     LOS: 8 days   Time spent= 35 mins    Fard Borunda Joline Maxcy, MD [facility]  If 7PM-7AM, please contact night-coverage  01/20/2022, 11:03 AM

## 2022-01-20 NOTE — Progress Notes (Signed)
Occupational Therapy Treatment Patient Details Name: Diana Horton MRN: [id number] DOB: [birth year] Today's Date: 01/20/2022   History of present illness Patient is a 81 year old female who presented after a fall at memory care unit. Patient developed a L side hematoma and sent to the hospital. 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 Patient underwent thoracentesis on 12/18. PMH: dementia, B12 deficiency, osteoporosis.   OT comments  The patient noted to be more calm/less restless today, as compared to the prior OT session. She was with improved ability to participate in the session, and was noted to repeatedly request to use the bathroom. As such, toileting instruction was provided with the pt assisted to the bedside commode. She was unable to successfully void, though she required increased assist for clothing management, balance assist in standing, and cues for general safety awareness and sequencing. Throughout the session, she was noted to state on a several instances, "is Diana Horton okay?"; redirection was provided as needed. Continue OT plan of care.    Recommendations for follow up therapy are one component of a multi-disciplinary discharge planning process, led by the attending physician.  Recommendations may be updated based on patient status, additional functional criteria and insurance authorization.    Follow Up Recommendations  Home health OT/Return to memory care     Assistance Recommended at Discharge Frequent or constant Supervision/Assistance  Patient can return home with the following  A lot of help with bathing/dressing/bathroom;Direct supervision/assist for medications management;Direct supervision/assist for financial management   Equipment Recommendations  None recommended by OT       Precautions / Restrictions Precautions Precautions: Fall Precaution Comments: R  side rib fractures, L side hematoma Restrictions Weight Bearing Restrictions: No       Mobility Bed Mobility Overal bed mobility: Needs Assistance Bed Mobility: Supine to Sit, Sit to Supine     Supine to sit: Mod assist Sit to supine: Mod assist   General bed mobility comments: Required cues for initiation, to perform supine to sit    Transfers Overall transfer level: Needs assistance Equipment used: Rolling walker (2 wheels) Transfers: Sit to/from Stand Sit to Stand: Mod assist                     ADL either performed or assessed with clinical judgement   ADL Overall ADL's : Needs assistance/impaired        Lower Body Dressing: Maximal assistance;Bed level   Toilet Transfer: Moderate assistance;Squat-pivot;Cueing for safety;Cueing for sequencing Toilet Transfer Details (indicate cue type and reason): Pt repeatedly reported needing to use the restroom, therefore she was instructed on performing a squat-pivot transfer to the bedside commode. She required mod assist overall, with cues for sequencing & safety awareness. She used a RW to perform a stand-pivot transfer back to bed, requiring mod assist, balancing assist, cues for BUE placement on walker, and cues for safety, as she attempted to sit prematurely.   Toileting - Clothing Manipulation Details (indicate cue type and reason): The pt was unable to successfully void, despite attempts at bedside commode level. She did however require max assist for clothing management.                       Cognition Arousal/Alertness: Awake/alert   Overall Cognitive Status: History of cognitive impairments - at baseline Area of Impairment: Attention, Following commands, Safety/judgement, Awareness, Problem solving  General Comments: more calm and less restless today, intermittent confusion                   Pertinent Vitals/ Pain       Pain Assessment Pain Location: She did not report having pain          Frequency  Min 2X/week        Progress Toward Goals  OT Goals(current goals can now be found in the care plan section)     Acute Rehab OT Goals OT Goal Formulation: With patient Time For Goal Achievement: 01/26/22 Potential to Achieve Goals: Fair  Plan         AM-PAC OT "6 Clicks" Daily Activity     Outcome Measure   Help from another person eating meals?: A Little Help from another person taking care of personal grooming?: A Lot Help from another person toileting, which includes using toliet, bedpan, or urinal?: A Lot Help from another person bathing (including washing, rinsing, drying)?: A Lot Help from another person to put on and taking off regular upper body clothing?: A Lot Help from another person to put on and taking off regular lower body clothing?: A Lot 6 Click Score: 13    End of Session Equipment Utilized During Treatment: Rolling walker (2 wheels)  OT Visit Diagnosis: Unsteadiness on feet (R26.81);Muscle weakness (generalized) (M62.81)   Activity Tolerance Other (comment) (Fair overall tolerance)   Patient Left in bed;with call bell/phone within reach (Patient transport present)   Nurse Communication Mobility status        Time: 5621-3086 OT Time Calculation (min): 11 min  Charges: OT General Charges $OT Visit: 1 Visit OT Treatments $Self Care/Home Management : 8-22 mins    Reuben Likes, OTR/L 01/20/2022, 2:42 PM

## 2022-01-21 DIAGNOSIS — F0390 Unspecified dementia without behavioral disturbance: Secondary | ICD-10-CM | POA: Diagnosis not present

## 2022-01-21 DIAGNOSIS — D62 Acute posthemorrhagic anemia: Secondary | ICD-10-CM

## 2022-01-21 DIAGNOSIS — J942 Hemothorax: Secondary | ICD-10-CM | POA: Diagnosis not present

## 2022-01-21 LAB — CBC
HCT: 31.4 % — ABNORMAL LOW (ref 36.0–46.0)
Hemoglobin: 9.3 g/dL — ABNORMAL LOW (ref 12.0–15.0)
MCH: 27 pg (ref 26.0–34.0)
MCHC: 29.6 g/dL — ABNORMAL LOW (ref 30.0–36.0)
MCV: 91 fL (ref 80.0–100.0)
Platelets: 339 10*3/uL (ref 150–400)
RBC: 3.45 MIL/uL — ABNORMAL LOW (ref 3.87–5.11)
RDW: 16.4 % — ABNORMAL HIGH (ref 11.5–15.5)
WBC: 8 10*3/uL (ref 4.0–10.5)
nRBC: 0 % (ref 0.0–0.2)

## 2022-01-21 LAB — BASIC METABOLIC PANEL
Anion gap: 9 (ref 5–15)
BUN: 27 mg/dL — ABNORMAL HIGH (ref 8–23)
CO2: 29 mmol/L (ref 22–32)
Calcium: 9 mg/dL (ref 8.9–10.3)
Chloride: 104 mmol/L (ref 98–111)
Creatinine, Ser: 1.02 mg/dL — ABNORMAL HIGH (ref 0.44–1.00)
GFR, Estimated: 56 mL/min — ABNORMAL LOW (ref >60)
Glucose, Bld: 101 mg/dL — ABNORMAL HIGH (ref 70–99)
Potassium: 3.9 mmol/L (ref 3.5–5.1)
Sodium: 142 mmol/L (ref 135–145)

## 2022-01-21 LAB — GLUCOSE, CAPILLARY
Glucose-Capillary: 104 mg/dL — ABNORMAL HIGH (ref 70–99)
Glucose-Capillary: 106 mg/dL — ABNORMAL HIGH (ref 70–99)
Glucose-Capillary: 58 mg/dL — ABNORMAL LOW (ref 70–99)
Glucose-Capillary: 84 mg/dL (ref 70–99)
Glucose-Capillary: 86 mg/dL (ref 70–99)

## 2022-01-21 LAB — MAGNESIUM: Magnesium: 2.2 mg/dL (ref 1.7–2.4)

## 2022-01-21 MED ORDER — CHLORHEXIDINE GLUCONATE CLOTH 2 % EX PADS
6.0000 | MEDICATED_PAD | Freq: Every day | CUTANEOUS | Status: DC
  Administered 2022-01-21 – 2022-01-27 (×6): 6 via TOPICAL

## 2022-01-21 NOTE — Progress Notes (Signed)
PROGRESS NOTE    Diana Horton  [id number] DOB: [birth year] DOA: 01/11/2022 PCP: Pcp, No   Brief Narrative:  81 year old female with history of dementia, osteoporosis, B12 deficiency admitted for fall from [facility] memory care unit.  Her fall was unwitnessed and unfortunately sustained another fall while in the hospital on 12/8.  She was found to have multiple acute right-sided rib fracture with hemothorax.  EDP discussed case with trauma surgery who recommended conservative management at this time and did not require any chest tube.  Off-and-on during hospitalization patient has had increasing agitation requiring Haldol, Zyprexa and Xanax.  Eventually UA eventually UA was suggestive of urinary tract infection therefore started on empiric IV Rocephin.  Cultures eventually grew E. coli which was sensitive to Rocephin.     Assessment & Plan:   Multiple acute right rib fracture secondary to mechanical fall Subacute/chronic right-sided hemothorax-traumatic History of osteoporosis  Patient underwent thoracentesis.  Case was discussed with trauma surgery who did not think that patient will need chest tube.   Thoracentesis was repeated on 12/26.  1 L of dark bloody fluid was aspirated.   Seems to be stable from a respiratory standpoint.  Monitor oxygen saturation levels.  Complete opacification of right lung  Likely due to combination of fluid and mucous plugging.  Pulmonology was consulted.   Underwent thoracentesis again as discussed above. Bronchoscopy was discussed with patient's son.  Based on patient's previous known wishes procedure was not pursued.  Please see Dr. Myrlene Broker note for details. Has been stable from a respiratory standpoint but is at high risk for decompensation.  She is DNR.  Based on discussions between patient's son and pulmonology it appears that plan will be for mainly comfort care if patient were to decline.     History of recurrent fall. X-ray hip, CT head, CT  chest, CT C-spine and CT abdomen and pelvis were performed.  Only showing acute rib fracture, stable T7 fracture.  No other obvious evidence of acute trauma.  Urinary tract infection Secondary to E. coli, cultures are sensitive to Rocephin.  Completed course of ceftriaxone.   History of severe dementia. Intermittent agitation Off-and-on agitation.  She is at risk of getting out of bed and falling.   EKG shows normal QTc, as needed Zyprexa, also received as needed Haldol.   Off-and-on requiring restraints Bedtime Risperdal  Acute blood loss anemia Drop in hemoglobin is due to hemothorax.  Has been stable for the last several days.    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   Goals of care Patient is DNR.  Based on discussions between son and pulmonology plan is to provide mainly supportive care.  No aggressive interventions.  Will see how the patient does over the next 24 to 48 hours.    DVT prophylaxis: SCDs Code Status: DNR Family Communication: Son to be updated Disposition: To be determined she is here from a memory care unit.  Status is: Inpatient Remains inpatient appropriate because plans for thoracentesis today, plans for bronchoscopy tomorrow.  Subjective: Noted to be confused this morning.  Noted to be restrained.  Does not appear to be in any discomfort.  Objective: Vitals:   01/20/22 1316 01/20/22 1436 01/20/22 2304 01/21/22 0701  BP: (!) 135/93 (!) 142/103 107/73 (!) 144/83  Pulse:   (!) 50 74  Resp: Temp: 98 F (36.7 C)  97.8 F (36.6 C) 98.1 F (36.7 C)  TempSrc: Oral  Oral  Oral  SpO2:   91% (!) 83%  Weight:      Height:        Intake/Output Summary (Last 24 hours) at 01/21/2022 1247 Last data filed at 01/21/2022 1228 Gross per 24 hour  Intake 600 ml  Output 425 ml  Net 175 ml     Filed Weights   01/13/22 1851  Weight: 45 kg     Examination:  General appearance: Awake alert.  In no distress.   Distracted Resp: Diminished air entry bilateral lungs right more than left.  Few crackles in the bases on the left. Cardio: S1-S2 is normal regular.  No S3-S4.  No rubs murmurs or bruit GI: Abdomen is soft.  Nontender nondistended.  Bowel sounds are present normal.  No masses organomegaly Extremities: No edema.  Moving all extremities.  Physical deconditioning is noted. Neurologic: No obvious focal neurological deficits.     Data Reviewed:   CBC: Recent Labs  Lab 01/17/22 0744 01/18/22 [id number] 01/19/22 0719 01/20/22 0557 01/21/22 0833  WBC 8.7 8.3 8.5 7.4 8.0  HGB 10.0* 9.2* 9.4* 9.2* 9.3*  HCT 33.0* 30.8* 31.3* 31.0* 31.4*  MCV 90.9 91.4 91.5 89.9 91.0  PLT 376 375 348 354 339    Basic Metabolic Panel: Recent Labs  Lab 01/17/22 0744 01/18/22 0632 01/19/22 0719 01/20/22 0557 01/21/22 0833  NA 138 142 142 139 142  K 3.4* 4.3 3.3* 3.4* 3.9  CL 102 107 101 101 104  CO2 27 28 30 29 29   GLUCOSE 112* 109* 121* 95 101*  BUN 16 23 25* 23 27*  CREATININE 0.90 0.93 1.14* 0.90 1.02*  CALCIUM 8.9 8.9 8.9 9.0 9.0  MG 2.2 2.2 2.2 2.1 2.2    GFR: Estimated Creatinine Clearance: 31.3 mL/min (A) (by C-G formula based on SCr of 1.02 mg/dL (H)).  CBG: Recent Labs  Lab 01/20/22 1114 01/20/22 1827 01/20/22 2341 01/21/22 0655 01/21/22 1142  GLUCAP 94 71 88 86 106*     Recent Results (from the past 240 hour(s))  Culture, blood (routine x 2)     Status: None   Collection Time: 01/11/22  1:36 PM   Specimen: BLOOD  Result Value Ref Range Status   Specimen Description   Final    BLOOD LEFT ANTECUBITAL Performed at [facility], [address] [place], [place] [address]    Special Requests   Final    BOTTLES DRAWN AEROBIC AND ANAEROBIC Blood Culture results may not be optimal due to an inadequate volume of blood received in culture bottles Performed at [facility], [address] [place], [place] [address]    Culture   Final    NO  GROWTH 5 DAYS Performed at [facility], [address] [place], [place] [address]    Report Status 01/16/2022 FINAL  Final  Resp panel by RT-PCR (RSV, Flu A&B, Covid) Anterior Nasal Swab     Status: None   Collection Time: 01/11/22  1:36 PM   Specimen: Anterior Nasal Swab  Result Value Ref Range Status   SARS Coronavirus 2 by RT PCR NEGATIVE NEGATIVE Final    Comment: (NOTE) SARS-CoV-2 target nucleic acids are NOT DETECTED.  The SARS-CoV-2 RNA is generally detectable in upper respiratory specimens during the acute phase of infection. The lowest concentration of SARS-CoV-2 viral copies this assay can detect is 138 copies/mL. A negative result does not preclude SARS-Cov-2 infection and should not be used as the sole basis for treatment or other  patient management decisions. A negative result may occur with  improper specimen collection/handling, submission of specimen other than nasopharyngeal swab, presence of viral mutation(s) within the areas targeted by this assay, and inadequate number of viral copies(<138 copies/mL). A negative result must be combined with clinical observations, patient history, and epidemiological information. The expected result is Negative.  Fact Sheet for Patients:  BloggerCourse.com  Fact Sheet for Healthcare Providers:  SeriousBroker.it  This test is no t yet approved or cleared by the [place] FDA and  has been authorized for detection and/or diagnosis of SARS-CoV-2 by FDA under an Emergency Use Authorization (EUA). This EUA will remain  in effect (meaning this test can be used) for the duration of the COVID-19 declaration under Section 564(b)(1) of the Act, 21 U.S.C.section 360bbb-3(b)(1), unless the authorization is terminated  or revoked sooner.       Influenza A by PCR NEGATIVE NEGATIVE Final   Influenza B by PCR NEGATIVE NEGATIVE Final    Comment: (NOTE) The Xpert Xpress  SARS-CoV-2/FLU/RSV plus assay is intended as an aid in the diagnosis of influenza from Nasopharyngeal swab specimens and should not be used as a sole basis for treatment. Nasal washings and aspirates are unacceptable for Xpert Xpress SARS-CoV-2/FLU/RSV testing.  Fact Sheet for Patients: BloggerCourse.com  Fact Sheet for Healthcare Providers: SeriousBroker.it  This test is not yet approved or cleared by the [place] FDA and has been authorized for detection and/or diagnosis of SARS-CoV-2 by FDA under an Emergency Use Authorization (EUA). This EUA will remain in effect (meaning this test can be used) for the duration of the COVID-19 declaration under Section 564(b)(1) of the Act, 21 U.S.C. section 360bbb-3(b)(1), unless the authorization is terminated or revoked.     Resp Syncytial Virus by PCR NEGATIVE NEGATIVE Final    Comment: (NOTE) Fact Sheet for Patients: BloggerCourse.com  Fact Sheet for Healthcare Providers: SeriousBroker.it  This test is not yet approved or cleared by the [place] FDA and has been authorized for detection and/or diagnosis of SARS-CoV-2 by FDA under an Emergency Use Authorization (EUA). This EUA will remain in effect (meaning this test can be used) for the duration of the COVID-19 declaration under Section 564(b)(1) of the Act, 21 U.S.C. section 360bbb-3(b)(1), unless the authorization is terminated or revoked.  Performed at [facility], [address], [place], [place] [address]   Culture, blood (routine x 2)     Status: None   Collection Time: 01/11/22  1:41 PM   Specimen: BLOOD  Result Value Ref Range Status   Specimen Description   Final    BLOOD LEFT ANTECUBITAL Performed at [facility], [address], [place], [place] [address]    Special Requests   Final    BOTTLES DRAWN AEROBIC AND ANAEROBIC  Blood Culture adequate volume Performed at [facility], [address], [place], [place] [address]    Culture   Final    NO GROWTH 5 DAYS Performed at [facility], [address], [place], [place] [address]    Report Status 01/16/2022 FINAL  Final  Urine Culture     Status: Abnormal   Collection Time: 01/17/22 12:46 PM   Specimen: Urine, Clean Catch  Result Value Ref Range Status   Specimen Description   Final    URINE, CLEAN CATCH Performed at [facility], [address] [place], [place] [address]    Special Requests   Final    NONE Performed  at [facility], [address] [place], [place] [address]    Culture >=100,000 COLONIES/mL ESCHERICHIA COLI (A)  Final   Report Status 01/19/2022 FINAL  Final   Organism ID, Bacteria ESCHERICHIA COLI (A)  Final      Susceptibility   Escherichia coli - MIC*    AMPICILLIN >=32 RESISTANT Resistant     CEFAZOLIN <=4 SENSITIVE Sensitive     CEFEPIME <=0.12 SENSITIVE Sensitive     CEFTRIAXONE <=0.25 SENSITIVE Sensitive     CIPROFLOXACIN >=4 RESISTANT Resistant     GENTAMICIN <=1 SENSITIVE Sensitive     IMIPENEM <=0.25 SENSITIVE Sensitive     NITROFURANTOIN <=16 SENSITIVE Sensitive     TRIMETH/SULFA <=20 SENSITIVE Sensitive     AMPICILLIN/SULBACTAM 16 INTERMEDIATE Intermediate     PIP/TAZO <=4 SENSITIVE Sensitive     * >=100,000 COLONIES/mL ESCHERICHIA COLI  Body fluid culture w Gram Stain     Status: None (Preliminary result)   Collection Time: 01/20/22  2:33 PM   Specimen: PATH Cytology Pleural fluid  Result Value Ref Range Status   Specimen Description   Final    PLEURAL Performed at [facility], [address], [place], [place] [address]    Special Requests RIGHT  Final   Gram Stain   Final    RARE WBC PRESENT,BOTH PMN AND MONONUCLEAR NO ORGANISMS SEEN    Culture   Final    NO GROWTH < 24 HOURS Performed at [facility], [address] [place], [place] [address]    Report Status PENDING  Incomplete         Radiology Studies: US THORACENTESIS ASP PLEURAL SPACE W/IMG GUIDE  Result Date: 01/20/2022 INDICATION: Patient with history of dementia, recent fall with rib fractures, right lung collapse with right pleural effusion/hemothorax; request received for diagnostic and therapeutic right thoracentesis EXAM: ULTRASOUND GUIDED DIAGNOSTIC AND THERAPEUTIC RIGHT THORACENTESIS MEDICATIONS: 8 ml 1% lidocaine COMPLICATIONS: None immediate. PROCEDURE: An ultrasound guided thoracentesis was thoroughly discussed with the patient's son and questions answered. The benefits, risks, alternatives and complications were also discussed. The patient's son understands and wishes to proceed with the procedure. Written consent was obtained. Ultrasound was performed to localize and mark an adequate pocket of fluid in the right chest. The area was then prepped and draped in the normal sterile fashion. 1% Lidocaine was used for local anesthesia. Under ultrasound guidance a 6 Fr Safe-T-Centesis catheter was introduced. Thoracentesis was performed. The catheter was removed and a dressing applied. FINDINGS: A total of approximately 1 liter of dark,bloody fluid was removed. Samples were sent to the laboratory as requested by the clinical team. IMPRESSION: Successful ultrasound guided diagnostic and therapeutic right thoracentesis yielding 1 liter of pleural fluid. Read by: Jeananne Rama, PA-C Electronically Signed   By: Richarda Overlie M.D.   On: 01/20/2022 16:07   DG CHEST PORT 1 VIEW  Result Date: 01/20/2022 CLINICAL DATA:  Status post right thoracentesis. EXAM: PORTABLE CHEST 1 VIEW COMPARISON:  Same day. FINDINGS: No pneumothorax is noted status post right thoracentesis. Right pleural effusion is significantly smaller. IMPRESSION: No pneumothorax status post right-sided thoracentesis. Electronically Signed   By: Lupita Raider M.D.   On: 01/20/2022 15:01    DG Chest Port 1 View  Result Date: 01/20/2022 CLINICAL DATA:  Dyspnea.  Blunt chest trauma with right hemothorax. EXAM: PORTABLE CHEST 1 VIEW COMPARISON:  01/19/2022 FINDINGS: Complete opacification of right hemithorax is again seen with shift of mediastinal structures to the  left. This is consistent with a large right hemothorax. No pneumothorax visualized. Multiple right-sided rib fractures are again seen. Mild opacity in left retrocardiac lung base is unchanged, and differential diagnosis includes pulmonary contusion, pneumonia, or aspiration. IMPRESSION: No significant change in large right hemothorax, with mediastinal shift to the left. Multiple right-sided rib fractures. No pneumothorax visualized. Stable mild left retrocardiac opacity, which may be due to pulmonary contusion, pneumonia, or aspiration. Electronically Signed   By: Danae Orleans M.D.   On: 01/20/2022 10:04      Scheduled Meds:  dextromethorphan-guaiFENesin  1 tablet Oral BID   feeding supplement  237 mL Oral BID BM   melatonin  6 mg Oral QHS   memantine  10 mg Oral BID   risperiDONE  2 mg Oral QHS   sertraline  200 mg Oral q AM   traZODone  50 mg Oral QHS   Continuous Infusions:     LOS: 9 days    Osvaldo Shipper, MD [facility]  If 7PM-7AM, please contact night-coverage  01/21/2022, 12:47 PM

## 2022-01-21 NOTE — Progress Notes (Incomplete)
Pt blood sugar 58, increase PO at dinner time. Pt completed whole meal

## 2022-01-21 NOTE — Progress Notes (Signed)
Nutrition Follow-up  DOCUMENTATION CODES:   Severe malnutrition in context of chronic illness, Underweight  INTERVENTION:  Ensure Enlive po BID, each supplement provides 350 kcal and 20 grams of protein. 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  Ongoing  GOAL:   Patient will meet greater than or equal to 90% of their needs  Progressing  MONITOR:   PO intake, Supplement acceptance, Labs, Weight trends, I & O's  REASON FOR ASSESSMENT:   Consult Assessment of nutrition requirement/status  ASSESSMENT:   81 y.o. female with PMH significant of dementia osteoporosis, B12 deficiency presenting to the hospital with a fall.  Pt a/o x1. No family present at time of visit. Pt requesting water and to get out of gown. Discussed with NT in hallway. NT brought pt water at time of visit. NT reports pt has been eating well for her today. She drank 1 Ensure. Unclear whether pt is consuming [employer]. Will continue with both orders.   Meal completions: 12/25: 10% breakfast, 0% lunch 12/26: 90% breakfast 12/27: 75% breakfast, 50% lunch, 75% dinner  No updated weight on file since admit. Will request updated weight to assess.   Medications reviewed  Labs: BUN 27, Cr 1.02, CBG's 58-106 x24 hours  Diet Order:   Diet Order             DIET DYS 3 Room service appropriate? Yes; Fluid consistency: Thin  Diet effective now                   EDUCATION NEEDS:   Not appropriate for education at this time  Skin:  Skin Assessment: Reviewed RN Assessment  Last BM:  PTA  Height:   Ht Readings from Last 1 Encounters:  01/13/22 5' 7.5" (1.715 m)    Weight:   Wt Readings from Last 1 Encounters:   01/13/22 45 kg   BMI:  Body mass index is 15.31 kg/m.  Estimated Nutritional Needs:   Kcal:  1650-1850  Protein:  75-90g  Fluid:  1.8L/day  Diana Horton, RDN, LDN Clinical Nutrition

## 2022-01-22 ENCOUNTER — Inpatient Hospital Stay (HOSPITAL_COMMUNITY): Payer: Medicare HMO

## 2022-01-22 DIAGNOSIS — J942 Hemothorax: Secondary | ICD-10-CM | POA: Diagnosis not present

## 2022-01-22 LAB — GLUCOSE, CAPILLARY
Glucose-Capillary: 78 mg/dL (ref 70–99)
Glucose-Capillary: 102 mg/dL — ABNORMAL HIGH (ref 70–99)
Glucose-Capillary: 127 mg/dL — ABNORMAL HIGH (ref 70–99)

## 2022-01-22 NOTE — Progress Notes (Signed)
Physical Therapy Treatment Patient Details Name: Diana Horton MRN: [id number] DOB: 1940/02/09 Today's Date: 01/22/2022   History of Present Illness Patient is a 81 year old female who presented after a fall at memory care unit. Patient developed a L side hematoma and sent to the hospital. 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 Patient underwent thoracentesis on 12/18. PMH: dementia, B12 deficiency, osteoporosis.    PT Comments    Pt crying for help.  "I can't breath", "I can't breath".  Pt on 2 lts nasal sats 94%.  RR 32  Pt Hx dementia but following directions and appears anxious.  "I can't breath".  Assisted pt to EOB and called RN to room.  Trial RA 89%.  Assisted to recliner was difficult and required + 2 assist.  General transfer comment: required + 2 Max Assist with difficulty self rising "skiing" feet and incomplete turns.  Poor posture/balance.  Total Assist to complete 1/4 pivot.  Posterior LOB/lean with NO self correction.  Unable to support herself upright. Multiple pillows in recliner to position pt upright.  Reapplied 2 lts.    Pt is from ALF.  Unsure if she is able to return as she currently requires Total Care.  Depends on how much assist her facility can provide.  If unable, pt will need a high level of care such as SNF.  Will consult LPT, pt's lack of progress with her mobility.   Recommendations for follow up therapy are one component of a multi-disciplinary discharge planning process, led by the attending physician.  Recommendations may be updated based on patient status, additional functional criteria and insurance authorization.  Follow Up Recommendations  Home health PT [facility] @ ALF IF ALF can provide current level of care otherwise will need SNF)     Assistance Recommended at Discharge Frequent or constant Supervision/Assistance  Patient can return home with the  following Two people to help with bathing/dressing/bathroom;Two people to help with walking and/or transfers;Direct supervision/assist for medications management;Assist for transportation;Help with stairs or ramp for entrance   Equipment Recommendations  None recommended by PT    Recommendations for Other Services       Precautions / Restrictions Precautions Precautions: Fall Precaution Comments: R side rib fractures, L side hematoma Restrictions Weight Bearing Restrictions: No     Mobility  Bed Mobility Overal bed mobility: Needs Assistance Bed Mobility: Supine to Sit     Supine to sit: Mod assist     General bed mobility comments: Required cues for initiation, to perform supine to sit with increased time    Transfers Overall transfer level: Needs assistance Equipment used: Rolling walker (2 wheels) Transfers: Sit to/from Stand Sit to Stand: +2 physical assistance, +2 safety/equipment, From elevated surface, Max assist Stand pivot transfers: +2 safety/equipment, Max assist, Total assist         General transfer comment: required + 2 Max Assist with difficulty self rising "skiing" feet and incomplete turns.  Poor posture/balance.  Total Assist to complete 1/4 pivot.  Posterior LOB/lean with NO self correction.  Unable to support herself upright.    Ambulation/Gait               General Gait Details: unable to take any functional steps.  Unable to weight shift.  B fett "skiingSoftware engineer Rankin (Stroke  Patients Only)       Balance                                            Cognition Arousal/Alertness: Awake/alert Behavior During Therapy: Impulsive, Restless Overall Cognitive Status: No family/caregiver present to determine baseline cognitive functioning Area of Impairment: Attention, Following commands, Safety/judgement, Awareness, Problem solving                        Following Commands: Follows one step commands inconsistently Safety/Judgement: Decreased awareness of safety, Decreased awareness of deficits     General Comments: Pt repeating "I can't breath" and "I'm sorry to bother you" , "I can't breath".        Exercises      General Comments        Pertinent Vitals/Pain Pain Assessment Pain Assessment: No/denies pain    Home Living                          Prior Function            PT Goals (current goals can now be found in the care plan section) Progress towards PT goals: Progressing toward goals    Frequency    Min 2X/week      PT Plan Current plan remains appropriate    Co-evaluation              AM-PAC PT "6 Clicks" Mobility   Outcome Measure  Help needed turning from your back to your side while in a flat bed without using bedrails?: A Lot Help needed moving from lying on your back to sitting on the side of a flat bed without using bedrails?: A Lot Help needed moving to and from a bed to a chair (including a wheelchair)?: A Lot Help needed standing up from a chair using your arms (e.g., wheelchair or bedside chair)?: A Lot Help needed to walk in hospital room?: Total Help needed climbing 3-5 steps with a railing? : Total 6 Click Score: 10    End of Session Equipment Utilized During Treatment: Gait belt Activity Tolerance: Patient limited by fatigue Patient left: in chair;with call bell/phone within reach;with chair alarm set Nurse Communication: Mobility status (RN called to room to listen to chest sounds and address pt's c/o inability to breath) PT Visit Diagnosis: Other symptoms and signs involving the nervous system (R29.898);Other abnormalities of gait and mobility (R26.89);Repeated falls (R29.6);Pain     Time: 1420-1447 PT Time Calculation (min) (ACUTE ONLY): 27 min  Charges:  $Therapeutic Activity: 23-37 mins                    Rica Koyanagi  PTA Acute  Rehabilitation  Services Office M-F          [phone] Weekend pager [phone]

## 2022-01-22 NOTE — Progress Notes (Addendum)
PROGRESS NOTE    Diana Horton  YJE:[id number] DOB: 11/21/1940 DOA: 01/11/2022 PCP: Pcp, No   Brief Narrative:  81 year old female with history of dementia, osteoporosis, B12 deficiency admitted for fall from [facility] memory care unit.  Her fall was unwitnessed and unfortunately sustained another fall while in the hospital on 12/8.  She was found to have multiple acute right-sided rib fracture with hemothorax.  EDP discussed case with trauma surgery who recommended conservative management at this time and did not require any chest tube.  Off-and-on during hospitalization patient has had increasing agitation requiring Haldol, Zyprexa and Xanax.  Eventually UA eventually UA was suggestive of urinary tract infection therefore started on empiric IV Rocephin.  Cultures eventually grew E. coli which was sensitive to Rocephin.     Assessment & Plan:   Multiple acute right rib fracture secondary to mechanical fall Subacute/chronic right-sided hemothorax-traumatic History of osteoporosis  Patient underwent thoracentesis.  Case was discussed with trauma surgery who did not think that patient will need chest tube.   Thoracentesis was repeated on 12/26.  1 L of dark bloody fluid was aspirated.  Patient seems to be stable from a respiratory standpoint.  Saturations are in the early 90s on nasal cannula oxygen at 2 L/min. Patient complained of shortness of breath while working with PT. CXR shows re-accumulation of fluid. Patient likely experiencing slow oozing from her trauma. Discussed with Dr. Tonia Brooms. He said there are no good options but could attempt thoracentesis if family desires. Discussed with patient's Son. He wants to visit his mother tonight and then determine next steps. Patient is otherwise stable. No need for urgent thoracentesis. We can determine in AM.  Complete opacification of right lung  Likely due to combination of fluid and mucous plugging.  Pulmonology was consulted.   Underwent  thoracentesis again as discussed above. Bronchoscopy was discussed with patient's son.  Based on patient's previous known wishes procedure was not pursued.  Please see Dr. Myrlene Broker note for details. Has been stable from a respiratory standpoint but is at high risk for decompensation.  She is DNR.  Based on discussions between patient's son and pulmonology it appears that plan will be for mainly comfort care if patient were to decline.     History of recurrent fall. X-ray hip, CT head, CT chest, CT C-spine and CT abdomen and pelvis were performed which showed acute rib fracture, stable T7 fracture.  No other obvious evidence of acute trauma.  Urinary tract infection Secondary to E. coli, cultures are sensitive to Rocephin.  Completed course of ceftriaxone.   History of severe dementia. Intermittent agitation Off-and-on agitation.  She is at risk of getting out of bed and falling.   EKG shows normal QTc, as needed Zyprexa, also received as needed Haldol.   Off-and-on requiring restraints Bedtime Risperdal Try to get her off of restraints today.  Acute blood loss anemia Drop in hemoglobin is due to hemothorax.  Has been stable for the last several days.    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   Goals of care Patient is DNR.  Based on discussions between son and pulmonology plan is to provide mainly supportive care.  No aggressive interventions.  Will see how the patient does over the next 24 to 48 hours.    DVT prophylaxis: SCDs Code Status: DNR Family Communication: Son to be updated Disposition: To be determined. She is here from a memory care unit.  Status is: Inpatient Remains inpatient  appropriate because plans for thoracentesis today, plans for bronchoscopy tomorrow.  Subjective: Remains confused but does not appear to be in any discomfort.  Noted to be restrained.  Objective: Vitals:   01/21/22 0701 01/21/22 1326 01/21/22 2013 01/22/22 0546   BP: (!) 144/83 98/61 110/73 (!) 152/87  Pulse: 74 88 85 66  Resp: 17 18 18 18   Temp: 98.1 F (36.7 C) 98 F (36.7 C) 98.1 F (36.7 C) (!) 97.5 F (36.4 C)  TempSrc: Oral Oral Oral Oral  SpO2: (!) 83% 93% 92% 90%  Weight:      Height:        Intake/Output Summary (Last 24 hours) at 01/22/2022 1140 Last data filed at 01/22/2022 0838 Gross per 24 hour  Intake 1800 ml  Output 750 ml  Net 1050 ml     Filed Weights   01/13/22 1851  Weight: 45 kg     Examination:   General appearance: Awake alert.  In no distress.  Distracted. Resp: Diminished air entry bilaterally.  Crackles bilaterally left more than right. Cardio: S1-S2 is normal regular.  No S3-S4.  No rubs murmurs or bruit GI: Abdomen is soft.  Nontender nondistended.  Bowel sounds are present normal.  No masses organomegaly Extremities: No edema.  Full range of motion of lower extremities.  Physical deconditioning is noted. Neurologic: Disoriented.  No obvious focal neurological deficits.    Data Reviewed:   CBC: Recent Labs  Lab 01/17/22 0744 01/18/22 01/20/22 01/19/22 0719 01/20/22 0557 01/21/22 0833  WBC 8.7 8.3 8.5 7.4 8.0  HGB 10.0* 9.2* 9.4* 9.2* 9.3*  HCT 33.0* 30.8* 31.3* 31.0* 31.4*  MCV 90.9 91.4 91.5 89.9 91.0  PLT 376 375 348 354 339    Basic Metabolic Panel: Recent Labs  Lab 01/17/22 0744 01/18/22 0632 01/19/22 0719 01/20/22 0557 01/21/22 0833  NA 138 142 142 139 142  K 3.4* 4.3 3.3* 3.4* 3.9  CL 102 107 101 101 104  CO2 27 28 30 29 29   GLUCOSE 112* 109* 121* 95 101*  BUN 16 23 25* 23 27*  CREATININE 0.90 0.93 1.14* 0.90 1.02*  CALCIUM 8.9 8.9 8.9 9.0 9.0  MG 2.2 2.2 2.2 2.1 2.2    GFR: Estimated Creatinine Clearance: 31.3 mL/min (A) (by C-G formula based on SCr of 1.02 mg/dL (H)).  CBG: Recent Labs  Lab 01/21/22 1142 01/21/22 1745 01/21/22 1820 01/21/22 2350 01/22/22 0600  GLUCAP 106* 58* 84 104* 78     Recent Results (from the past 240 hour(s))  Urine Culture      Status: Abnormal   Collection Time: 01/17/22 12:46 PM   Specimen: Urine, Clean Catch  Result Value Ref Range Status   Specimen Description   Final    URINE, CLEAN CATCH Performed at [facility], [address] [place], [place]    Special Requests   Final    NONE Performed at [facility], [address] [place], [place]    Culture >=100,000 COLONIES/mL ESCHERICHIA COLI (A)  Final   Report Status 01/19/2022 FINAL  Final   Organism ID, Bacteria ESCHERICHIA COLI (A)  Final      Susceptibility   Escherichia coli - MIC*    AMPICILLIN >=32 RESISTANT Resistant     CEFAZOLIN <=4 SENSITIVE Sensitive     CEFEPIME <=0.12 SENSITIVE Sensitive     CEFTRIAXONE <=0.25 SENSITIVE Sensitive     CIPROFLOXACIN >=4 RESISTANT Resistant     GENTAMICIN <=1 SENSITIVE Sensitive  IMIPENEM <=0.25 SENSITIVE Sensitive     NITROFURANTOIN <=16 SENSITIVE Sensitive     TRIMETH/SULFA <=20 SENSITIVE Sensitive     AMPICILLIN/SULBACTAM 16 INTERMEDIATE Intermediate     PIP/TAZO <=4 SENSITIVE Sensitive     * >=100,000 COLONIES/mL ESCHERICHIA COLI  Body fluid culture w Gram Stain     Status: None (Preliminary result)   Collection Time: 01/20/22  2:33 PM   Specimen: PATH Cytology Pleural fluid  Result Value Ref Range Status   Specimen Description   Final    PLEURAL Performed at [facility], [address], [place], [place] [address]    Special Requests RIGHT  Final   Gram Stain   Final    RARE WBC PRESENT,BOTH PMN AND MONONUCLEAR NO ORGANISMS SEEN    Culture   Final    NO GROWTH 2 DAYS Performed at [facility], [address], [place], [place] [address]    Report Status PENDING  Incomplete         Radiology Studies: US THORACENTESIS ASP PLEURAL SPACE W/IMG GUIDE  Result Date: 01/20/2022 INDICATION: Patient with history of dementia, recent fall with rib fractures, right lung collapse with right pleural  effusion/hemothorax; request received for diagnostic and therapeutic right thoracentesis EXAM: ULTRASOUND GUIDED DIAGNOSTIC AND THERAPEUTIC RIGHT THORACENTESIS MEDICATIONS: 8 ml 1% lidocaine COMPLICATIONS: None immediate. PROCEDURE: An ultrasound guided thoracentesis was thoroughly discussed with the patient's son and questions answered. The benefits, risks, alternatives and complications were also discussed. The patient's son understands and wishes to proceed with the procedure. Written consent was obtained. Ultrasound was performed to localize and mark an adequate pocket of fluid in the right chest. The area was then prepped and draped in the normal sterile fashion. 1% Lidocaine was used for local anesthesia. Under ultrasound guidance a 6 Fr Safe-T-Centesis catheter was introduced. Thoracentesis was performed. The catheter was removed and a dressing applied. FINDINGS: A total of approximately 1 liter of dark,bloody fluid was removed. Samples were sent to the laboratory as requested by the clinical team. IMPRESSION: Successful ultrasound guided diagnostic and therapeutic right thoracentesis yielding 1 liter of pleural fluid. Read by: Jeananne Rama, PA-C Electronically Signed   By: Richarda Overlie M.D.   On: 01/20/2022 16:07   DG CHEST PORT 1 VIEW  Result Date: 01/20/2022 CLINICAL DATA:  Status post right thoracentesis. EXAM: PORTABLE CHEST 1 VIEW COMPARISON:  Same day. FINDINGS: No pneumothorax is noted status post right thoracentesis. Right pleural effusion is significantly smaller. IMPRESSION: No pneumothorax status post right-sided thoracentesis. Electronically Signed   By: Lupita Raider M.D.   On: 01/20/2022 15:01      Scheduled Meds:  Chlorhexidine Gluconate Cloth  6 each Topical Daily   dextromethorphan-guaiFENesin  1 tablet Oral BID   feeding supplement  237 mL Oral BID BM   melatonin  6 mg Oral QHS   memantine  10 mg Oral BID   risperiDONE  2 mg Oral QHS   sertraline  200 mg Oral q AM    traZODone  50 mg Oral QHS   Continuous Infusions:     LOS: 10 days    Osvaldo Shipper, MD [facility]  If 7PM-7AM, please contact night-coverage  01/22/2022, 11:40 AM

## 2022-01-22 NOTE — TOC Progression Note (Addendum)
Transition of Care [facility]) - Progression Note    Patient Details  Name: Diana Horton MRN: [id number] Date of Birth: 1940/10/07  Transition of [facility]) CM/SW Contact  Josecarlos Harriott, Olegario Messier, RN Phone Number: 01/22/2022, 2:17 PM  Clinical Narrative:  [facility] memory care rep [phone] will assess tomorrow after PT note if they can take back to memory care. Noted on 02. Spoke to son Jonny Ruiz aware of current update-will await post PT recc, & [facility] rep [place] onsite visit to provide options.    Expected Discharge Plan:  (TBD) Barriers to Discharge: Continued Medical Work up  Expected Discharge Plan and Services   Discharge Planning Services: CM Consult   Living arrangements for the past 2 months: Assisted Living Facility (Memory Care Unit)                                       Social Determinants of Health (SDOH) Interventions SDOH Screenings   Tobacco Use: Unknown (01/13/2022)    Readmission Risk Interventions     No data to display

## 2022-01-23 DIAGNOSIS — D62 Acute posthemorrhagic anemia: Secondary | ICD-10-CM | POA: Diagnosis not present

## 2022-01-23 DIAGNOSIS — F0390 Unspecified dementia without behavioral disturbance: Secondary | ICD-10-CM | POA: Diagnosis not present

## 2022-01-23 DIAGNOSIS — J942 Hemothorax: Secondary | ICD-10-CM | POA: Diagnosis not present

## 2022-01-23 LAB — BASIC METABOLIC PANEL
Anion gap: 5 (ref 5–15)
BUN: 36 mg/dL — ABNORMAL HIGH (ref 8–23)
CO2: 29 mmol/L (ref 22–32)
Calcium: 8.6 mg/dL — ABNORMAL LOW (ref 8.9–10.3)
Chloride: 100 mmol/L (ref 98–111)
Creatinine, Ser: 0.97 mg/dL (ref 0.44–1.00)
GFR, Estimated: 59 mL/min — ABNORMAL LOW (ref >60)
Glucose, Bld: 99 mg/dL (ref 70–99)
Potassium: 3.9 mmol/L (ref 3.5–5.1)
Sodium: 134 mmol/L — ABNORMAL LOW (ref 135–145)

## 2022-01-23 LAB — CBC
HCT: 28 % — ABNORMAL LOW (ref 36.0–46.0)
Hemoglobin: 8.7 g/dL — ABNORMAL LOW (ref 12.0–15.0)
MCH: 27.4 pg (ref 26.0–34.0)
MCHC: 31.1 g/dL (ref 30.0–36.0)
MCV: 88.3 fL (ref 80.0–100.0)
Platelets: 306 10*3/uL (ref 150–400)
RBC: 3.17 MIL/uL — ABNORMAL LOW (ref 3.87–5.11)
RDW: 16.2 % — ABNORMAL HIGH (ref 11.5–15.5)
WBC: 7.1 10*3/uL (ref 4.0–10.5)
nRBC: 0 % (ref 0.0–0.2)

## 2022-01-23 LAB — GLUCOSE, CAPILLARY
Glucose-Capillary: 106 mg/dL — ABNORMAL HIGH (ref 70–99)
Glucose-Capillary: 131 mg/dL — ABNORMAL HIGH (ref 70–99)
Glucose-Capillary: 162 mg/dL — ABNORMAL HIGH (ref 70–99)

## 2022-01-23 MED ORDER — HYDROCODONE-ACETAMINOPHEN 5-325 MG PO TABS
1.0000 | ORAL_TABLET | Freq: Four times a day (QID) | ORAL | Status: DC | PRN
  Administered 2022-01-23 – 2022-01-24 (×3): 1 via ORAL
  Filled 2022-01-23 (×3): qty 1

## 2022-01-23 MED ORDER — STERILE WATER FOR INJECTION IJ SOLN
INTRAMUSCULAR | Status: AC
  Filled 2022-01-23: qty 10

## 2022-01-23 MED ORDER — MORPHINE SULFATE 10 MG/5ML PO SOLN: 2.0000 mg | ORAL | Status: DC | PRN

## 2022-01-23 NOTE — TOC Progression Note (Addendum)
Transition of Care [facility]) - Progression Note    Patient Details  Name: Diana Horton MRN: [id number] Date of Birth: 1941/01/05  Transition of Care [facility]) CM/SW Contact  Praveen Coia, Olegario Messier, RN Phone Number: 01/23/2022, 9:39 AM  Clinical Narrative:  Per attending not medically stable today for d/c. [facility] ALF(memory care) rep [place] aware-she will decide when she can make onsite visit for acceptance if they can provide the care needed-she is aware of PT recc & [facility] asst/total care.MD updated.see prior TOC notes for additional info.  -10:28a  [facility] will not make onsite visit until more stable.They do not accept over weekend. MD updated.   Expected Discharge Plan:  (TBD) Barriers to Discharge: Continued Medical Work up  Expected Discharge Plan and Services   Discharge Planning Services: CM Consult   Living arrangements for the past 2 months: Assisted Living Facility (Memory Care Unit)                                       Social Determinants of Health (SDOH) Interventions SDOH Screenings   Tobacco Use: Unknown (01/13/2022)    Readmission Risk Interventions     No data to display

## 2022-01-23 NOTE — Progress Notes (Signed)
Occupational Therapy Treatment Patient Details Name: Diana Horton MRN: [id number] DOB: Jun 06, 1940 Today's Date: 01/23/2022   History of present illness Patient is a 81 year old female who presented after a fall at memory care unit. Patient developed a L side hematoma and sent to the hospital. 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 Patient underwent thoracentesis on 12/18. PMH: dementia, B12 deficiency, osteoporosis.   OT comments  ADL instruction and training was provided for pt performing grooming and dressing tasks eated at chair level. She required intermittent verbal and gestural cues for initiation and sustained attention to tasks. She also required min to moderate cues for sequencing upper and lower body dressing tasks. She stood 1 time requiring mod assist and use of a RW for support. She presented with improved command follow today. She was noted to repeatedly state, "please help me", though not always indicating what she needed help with. Continue OT plan of care.    Recommendations for follow up therapy are one component of a multi-disciplinary discharge planning process, led by the attending physician.  Recommendations may be updated based on patient status, additional functional criteria and insurance authorization.    Follow Up Recommendations  Home health OT vs.Skilled Nursing Facility, pending The Memory Care Facility's ability to manage pt's care needs at current)     Assistance Recommended at Discharge Frequent or constant Supervision/Assistance  Patient can return home with the following  A lot of help with bathing/dressing/bathroom;Direct supervision/assist for medications management;Direct supervision/assist for financial management;A lot of help with walking and/or transfers   Equipment Recommendations  None recommended by OT       Precautions / Restrictions  Precautions Precautions: Fall Precaution Comments: R side rib fractures, L side hematoma Restrictions Weight Bearing Restrictions: No       Mobility Bed Mobility    General bed mobility comments: Pt was received seated in bedside chair.    Transfers Overall transfer level: Needs assistance Equipment used: Rolling walker (2 wheels) Transfers: Sit to/from Stand Sit to Stand: Mod assist           General transfer comment: required cues for general safety and transfer technique, including hand placement and trunk extension in standing         ADL either performed or assessed with clinical judgement   ADL       Grooming: Minimal assistance;Sitting Grooming Details (indicate cue type and reason): She performed face face and hand washing seated in the chair, requiring min verbal and tactile cues for initiation of tasks and attention         Upper Body Dressing : Moderate assistance;Sitting Upper Body Dressing Details (indicate cue type and reason): She was instructed on doffing a hospital gown then donning another clean one seated in the chair. She required mod cues for sequencing and attention to task. Lower Body Dressing: Moderate assistance Lower Body Dressing Details (indicate cue type and reason): She implemented the figure 4 technique in order to donn her L sock seated in the chair. She was unable to donn her R sock, requiring assist in this regard.               General ADL Comments: ADL instruction and training was provided for performing grooming and dressing tasks with pt seated at chair level. She required intermittent verbal and gestural cues for initiation and sustained attention to tasks.  Cognition Arousal/Alertness: Awake/alert Behavior During Therapy: Restless Overall Cognitive Status: History of cognitive impairments - at baseline Area of Impairment: Attention, Following commands, Safety/judgement, Awareness, Problem solving           Following Commands: Follows one step commands inconsistently Safety/Judgement: Decreased awareness of safety, Decreased awareness of deficits                         Pertinent Vitals/ Pain       Pain Assessment Pain Assessment: Faces Pain Score: 0-No pain         Frequency  Min 2X/week        Progress Toward Goals  OT Goals(current goals can now be found in the care plan section)     Acute Rehab OT Goals OT Goal Formulation: Patient unable to participate in goal setting Time For Goal Achievement: 01/26/22 Potential to Achieve Goals: Fair  Plan Discharge plan remains appropriate       AM-PAC OT "6 Clicks" Daily Activity     Outcome Measure   Help from another person eating meals?: A Lot Help from another person taking care of personal grooming?: A Little Help from another person toileting, which includes using toliet, bedpan, or urinal?: A Lot Help from another person bathing (including washing, rinsing, drying)?: A Lot Help from another person to put on and taking off regular upper body clothing?: A Lot Help from another person to put on and taking off regular lower body clothing?: A Lot 6 Click Score: 13    End of Session Equipment Utilized During Treatment: Rolling walker (2 wheels)  OT Visit Diagnosis: Unsteadiness on feet (R26.81);Muscle weakness (generalized) (M62.81)   Activity Tolerance Other (comment) (Fair overall tolerance)   Patient Left in chair;with call bell/phone within reach;with family/visitor present   Nurse Communication Mobility status        Time: 1710-1725 OT Time Calculation (min): 15 min  Charges: OT General Charges $OT Visit: 1 Visit OT Treatments $Self Care/Home Management : 8-22 mins    Reuben Likes, OTR/L 01/23/2022, 5:43 PM

## 2022-01-23 NOTE — Progress Notes (Signed)
PROGRESS NOTE    Diana Horton  CXK:[id number] DOB: 11-May-1940 DOA: 01/11/2022 PCP: Pcp, No   Brief Narrative:  81 year old female with history of dementia, osteoporosis, B12 deficiency admitted for fall from [facility] memory care unit.  Her fall was unwitnessed and unfortunately sustained another fall while in the hospital on 12/8.  She was found to have multiple acute right-sided rib fracture with hemothorax.  EDP discussed case with trauma surgery who recommended conservative management at this time and did not require any chest tube.  Off-and-on during hospitalization patient has had increasing agitation requiring Haldol, Zyprexa and Xanax.  Eventually UA eventually UA was suggestive of urinary tract infection therefore started on empiric IV Rocephin.  Cultures eventually grew E. coli which was sensitive to Rocephin.     Assessment & Plan:   Multiple acute right rib fracture secondary to mechanical fall Subacute/chronic right-sided hemothorax-traumatic History of osteoporosis  Patient underwent thoracentesis.  Case was discussed with trauma surgery who did not think that patient will need chest tube.   Thoracentesis was repeated on 12/26.  1 L of dark bloody fluid was aspirated.  Patient was stable for several days but then again got short of breath yesterday.  Chest x-ray was repeated which shows reaccumulation of pleural effusion on the right.  Discussed with pulmonology, Dr. Tonia Brooms.  He mentioned that there are no good long-term options but could reattempt thoracentesis if family desires.  Discussed with patient's son.  He has spoken to other family members.  He came and visited the patient yesterday evening.  He feels that the patient is declining.  He wants to hold off on any further intervention at this time.  So we will not proceed with thoracentesis. Seems to be stable this morning.  Does not appear to be in any distress. Plan will be to monitor the patient over the weekend.  If  she continues to decline from a respiratory standpoint or has very poor oral intake then it may be reasonable to pursue residential hospice placement.  If she stabilizes and if her assisted living facility cannot take her back then we will need to pursue skilled nursing facility with palliative to follow.  It may not be unreasonable also to involve palliative care to assist with this decision making process.  We will consult them.  Complete opacification of right lung  Likely due to combination of fluid and mucous plugging.  Pulmonology was consulted.   Underwent thoracentesis as discussed above. Bronchoscopy was discussed with patient's son.  Based on patient's previous known wishes procedure was not pursued.  Please see Dr. Myrlene Broker note for details. Has been stable from a respiratory standpoint but is at high risk for decompensation.  She is DNR.  Based on discussions between patient's son and pulmonology it appears that plan will be for mainly comfort care if patient were to decline.     History of recurrent fall. X-ray hip, CT head, CT chest, CT C-spine and CT abdomen and pelvis were performed which showed acute rib fracture, stable T7 fracture.  No other obvious evidence of acute trauma.  Urinary tract infection Secondary to E. coli, cultures are sensitive to Rocephin.  Completed course of ceftriaxone.   History of severe dementia. Intermittent agitation Off-and-on agitation.  She is at risk of getting out of bed and falling.   EKG shows normal QTc, as needed Zyprexa, also received as needed Haldol.   Off-and-on requiring restraints Bedtime Risperdal  Acute blood loss anemia Drop in hemoglobin  is due to hemothorax.  Has been stable for the last several days.    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   Goals of care Patient is DNR.  Discussed with son.  See discussion above.  Plan is to observe the patient over the weekend.  If patient does not  decompensate then should be able to either return to her ALF/memory care unit if they are able to care for her or will need to go to SNF with palliative care.  If she decompensates or has poor oral intake may need to consider residential hospice.  However at the same time we will consult palliative care to assist [place] with this decision making process.  DVT prophylaxis: SCDs Code Status: DNR Family Communication: Discussed with son today. Disposition: To be determined. She is here from a memory care unit.  Status is: Inpatient Remains inpatient appropriate because plans for thoracentesis today, plans for bronchoscopy tomorrow.  Subjective: Remains confused.  Does not appear to be in any discomfort.  Objective: Vitals:   01/22/22 0546 01/22/22 1259 01/22/22 2019 01/23/22 0531  BP: (!) 152/87 114/75 131/86 121/76  Pulse: 66 87 89 (!) 58  Resp: 18 18 18 14   Temp: (!) 97.5 F (36.4 C) (!) 97.5 F (36.4 C) 98.1 F (36.7 C) 98.5 F (36.9 C)  TempSrc: Oral Oral Oral Oral  SpO2: 90% 100% 94% 97%  Weight:      Height:        Intake/Output Summary (Last 24 hours) at 01/23/2022 1148 Last data filed at 01/23/2022 0531 Gross per 24 hour  Intake 960 ml  Output 700 ml  Net 260 ml     Filed Weights   01/13/22 1851  Weight: 45 kg     Examination:  General appearance: Awake alert.  In no distress.  Distracted.  Confused. Resp: Diminished air entry at the bases bilaterally right more than left.  Crackles. Cardio: S1-S2 is normal regular.  No S3-S4.  No rubs murmurs or bruit GI: Abdomen is soft.  Nontender nondistended.  Bowel sounds are present normal.  No masses organomegaly    Data Reviewed:   CBC: Recent Labs  Lab 01/18/22 0632 01/19/22 0719 01/20/22 0557 01/21/22 0833 01/23/22 0656  WBC 8.3 8.5 7.4 8.0 7.1  HGB 9.2* 9.4* 9.2* 9.3* 8.7*  HCT 30.8* 31.3* 31.0* 31.4* 28.0*  MCV 91.4 91.5 89.9 91.0 88.3  PLT 375 348 354 339 306    Basic Metabolic Panel: Recent Labs   Lab 01/17/22 0744 01/18/22 0632 01/19/22 0719 01/20/22 0557 01/21/22 0833 01/23/22 0656  NA 138 142 142 139 142 134*  K 3.4* 4.3 3.3* 3.4* 3.9 3.9  CL 102 107 101 101 104 100  CO2 27 28 30 29 29 29   GLUCOSE 112* 109* 121* 95 101* 99  BUN 16 23 25* 23 27* 36*  CREATININE 0.90 0.93 1.14* 0.90 1.02* 0.97  CALCIUM 8.9 8.9 8.9 9.0 9.0 8.6*  MG 2.2 2.2 2.2 2.1 2.2  --     GFR: Estimated Creatinine Clearance: 32.9 mL/min (by C-G formula based on SCr of 0.97 mg/dL).  CBG: Recent Labs  Lab 01/22/22 1154 01/22/22 1755 01/22/22 2355 01/23/22 0535 01/23/22 1141  GLUCAP 102* 127* 162* 106* 131*     Recent Results (from the past 240 hour(s))  Urine Culture     Status: Abnormal   Collection Time: 01/17/22 12:46 PM   Specimen: Urine, Clean Catch  Result Value Ref Range Status  Specimen Description   Final    URINE, CLEAN CATCH Performed at [facility], [address] [place], [place] [address]    Special Requests   Final    NONE Performed at [facility], [address] [place], [place] [address]    Culture >=100,000 COLONIES/mL ESCHERICHIA COLI (A)  Final   Report Status 01/19/2022 FINAL  Final   Organism ID, Bacteria ESCHERICHIA COLI (A)  Final      Susceptibility   Escherichia coli - MIC*    AMPICILLIN >=32 RESISTANT Resistant     CEFAZOLIN <=4 SENSITIVE Sensitive     CEFEPIME <=0.12 SENSITIVE Sensitive     CEFTRIAXONE <=0.25 SENSITIVE Sensitive     CIPROFLOXACIN >=4 RESISTANT Resistant     GENTAMICIN <=1 SENSITIVE Sensitive     IMIPENEM <=0.25 SENSITIVE Sensitive     NITROFURANTOIN <=16 SENSITIVE Sensitive     TRIMETH/SULFA <=20 SENSITIVE Sensitive     AMPICILLIN/SULBACTAM 16 INTERMEDIATE Intermediate     PIP/TAZO <=4 SENSITIVE Sensitive     * >=100,000 COLONIES/mL ESCHERICHIA COLI  Body fluid culture w Gram Stain     Status: None (Preliminary result)   Collection Time: 01/20/22  2:33 PM   Specimen: PATH Cytology Pleural  fluid  Result Value Ref Range Status   Specimen Description   Final    PLEURAL Performed at [facility], [address] [place], [place] [address]    Special Requests RIGHT  Final   Gram Stain   Final    RARE WBC PRESENT,BOTH PMN AND MONONUCLEAR NO ORGANISMS SEEN    Culture   Final    NO GROWTH 3 DAYS Performed at [facility], [address], [place], [place] [address]    Report Status PENDING  Incomplete       Radiology Studies: DG CHEST PORT 1 VIEW  Result Date: 01/22/2022 CLINICAL DATA:  Dyspnea EXAM: PORTABLE CHEST 1 VIEW COMPARISON:  01/20/2022 FINDINGS: Significant increase in size of moderate-large right pleural effusion/hemothorax. Progressive airspace opacity within the right lung. Left basilar opacities unchanged. No pneumothorax. Numerous right-sided rib fractures. IMPRESSION: Significant increase in size of moderate-large right pleural effusion/hemothorax. Progressive airspace opacity within the right lung. Electronically Signed   By: Duanne Guess D.O.   On: 01/22/2022 15:53      Scheduled Meds:  Chlorhexidine Gluconate Cloth  6 each Topical Daily   dextromethorphan-guaiFENesin  1 tablet Oral BID   feeding supplement  237 mL Oral BID BM   melatonin  6 mg Oral QHS   memantine  10 mg Oral BID   risperiDONE  2 mg Oral QHS   sertraline  200 mg Oral q AM   traZODone  50 mg Oral QHS   Continuous Infusions:     LOS: 11 days    Osvaldo Shipper, MD [facility]  If 7PM-7AM, please contact night-coverage  01/23/2022, 11:48 AM

## 2022-01-23 NOTE — Care Management Important Message (Signed)
Important Message  Patient Details IM Letter placed in Patient's room Name: Diana Horton MRN: [id number] Date of Birth: May 21, 1940   Medicare Important Message Given:  Yes     Caren Macadam 01/23/2022, 9:53 AM

## 2022-01-24 DIAGNOSIS — J942 Hemothorax: Secondary | ICD-10-CM | POA: Diagnosis not present

## 2022-01-24 DIAGNOSIS — R531 Weakness: Secondary | ICD-10-CM | POA: Diagnosis not present

## 2022-01-24 DIAGNOSIS — Z7189 Other specified counseling: Secondary | ICD-10-CM | POA: Diagnosis not present

## 2022-01-24 DIAGNOSIS — Z515 Encounter for palliative care: Secondary | ICD-10-CM | POA: Diagnosis not present

## 2022-01-24 LAB — BODY FLUID CULTURE W GRAM STAIN: Culture: NO GROWTH

## 2022-01-24 LAB — GLUCOSE, CAPILLARY
Glucose-Capillary: 101 mg/dL — ABNORMAL HIGH (ref 70–99)
Glucose-Capillary: 77 mg/dL (ref 70–99)
Glucose-Capillary: 96 mg/dL (ref 70–99)

## 2022-01-24 MED ORDER — OLANZAPINE 5 MG PO TBDP
5.0000 mg | ORAL_TABLET | Freq: Every day | ORAL | Status: DC
  Administered 2022-01-25: 5 mg via ORAL
  Filled 2022-01-24 (×2): qty 1

## 2022-01-24 MED ORDER — HYDROMORPHONE HCL 1 MG/ML IJ SOLN: 0.5000 mg | INTRAMUSCULAR | Status: DC | PRN

## 2022-01-24 MED ORDER — OLANZAPINE 10 MG IM SOLR: 5.0000 mg | INTRAMUSCULAR | Status: DC | PRN

## 2022-01-24 MED ORDER — STERILE WATER FOR INJECTION IJ SOLN
INTRAMUSCULAR | Status: AC
  Filled 2022-01-24: qty 10

## 2022-01-24 MED ORDER — OLANZAPINE 5 MG PO TBDP: 5.0000 mg | ORAL_TABLET | Freq: Every day | ORAL | Status: DC

## 2022-01-24 MED ORDER — HALOPERIDOL LACTATE 5 MG/ML IJ SOLN
5.0000 mg | Freq: Once | INTRAMUSCULAR | Status: AC
  Administered 2022-01-24: 5 mg via INTRAMUSCULAR
  Filled 2022-01-24: qty 1

## 2022-01-24 NOTE — Progress Notes (Signed)
PROGRESS NOTE    Diana Horton  DJS:[id number] DOB: 1940-09-27 DOA: 01/11/2022 PCP: Pcp, No   Brief Narrative:  81 year old female with history of dementia, osteoporosis, B12 deficiency admitted for fall from [facility] memory care unit. Her fall was unwitnessed and unfortunately sustained another fall while in the hospital on 12/8. She was found to have multiple acute right-sided rib fracture with hemothorax. EDP discussed case with trauma surgery who recommended conservative management at this time and did not require any chest tube. Off-and-on, during hospitalization, patient has had increasing agitation requiring Haldol, Zyprexa and Xanax.  Patient was also treated with IV Rocephin for possible E. coli UTI.  Assessment & Plan:   Multiple acute right rib fracture secondary to mechanical fall Subacute/chronic right-sided hemothorax-traumatic History of osteoporosis  -Patient underwent thoracentesis.  Case was discussed with trauma surgery who did not think that patient will need chest tube.   -Thoracentesis was repeated on 12/26.  1 L of dark bloody fluid was aspirated.  Patient was stable for several days but then again got short of breath.  Chest x-ray was repeated which showed reaccumulation of pleural effusion on the right.  Discussed with pulmonology, Dr. Tonia Brooms.  He mentioned that there are no good long-term options but could reattempt thoracentesis if family desires.  Discussed with patient's son.  He has spoken to other family members.  He came and visited the patient.  He feels that the patient is declining.  He wants to hold off on any further intervention at this time.  So we will not proceed with thoracentesis. -Does not appear to be in any distress. Plan will be to monitor the patient over the weekend.  If she continues to decline from a respiratory standpoint or has very poor oral intake then it may be reasonable to pursue residential hospice placement.  If she stabilizes and  if her assisted living facility cannot take her back then we will need to pursue skilled nursing facility with palliative to follow.   - Awaiting palliative care consultation  Complete opacification of right lung  -Likely due to combination of fluid and mucous plugging.  Pulmonology was consulted.   -Underwent thoracentesis as discussed above. -Bronchoscopy was discussed with patient's son.  Based on patient's previous known wishes procedure was not pursued.  Please see Dr. Myrlene Broker note for details. -Has been stable from a respiratory standpoint but is at high risk for decompensation.  She is DNR.  Based on discussions between patient's son and pulmonology it appears that plan will be for mainly comfort care if patient were to decline.     History of recurrent fall. X-ray hip, CT head, CT chest, CT C-spine and CT abdomen and pelvis were performed which showed acute rib fracture, stable T7 fracture.  No other obvious evidence of acute trauma.   Urinary tract infection Secondary to E. coli, cultures are sensitive to Rocephin.  Completed course of ceftriaxone.   History of severe dementia. Intermittent agitation Off-and-on agitation.  She is at risk of getting out of bed and falling.   EKG shows normal Qtc. -Continue as needed Zyprexa and Haldol  -Off-and-on requiring restraints Continue bedtime Risperdal   Acute blood loss anemia -Drop in hemoglobin is due to hemothorax.  Has been stable for the last several days.     Ascending thoracic aorta aneurysm. -4.2 cm in size seen incidentally.  Outpatient follow-up   Goals of care Patient is DNR. Plan is to observe the patient over the weekend.  If patient does not decompensate then should be able to either return to her ALF/memory care unit if they are able to care for her or will need to go to SNF with palliative care.  If she decompensates or has poor oral intake may need to consider residential hospice.  However at the same time we will  consult palliative care to assist [place] with this decision making process.   DVT prophylaxis: SCDs Code Status: DNR Family Communication: None at bedside  disposition: To be determined. She is here from a memory care unit.   Status is: Inpatient Remains inpatient as discussed above  Consultants: PCCM  Procedures: As above  Antimicrobials: Completed course of Rocephin   Subjective: Patient seen and examined at bedside.  Poor historian.  No fever, vomiting, fever reported.  Required Zyprexa yesterday as per nursing staff.  Objective: Vitals:   01/22/22 2019 01/23/22 0531 01/23/22 2032 01/24/22 0422  BP: 131/86 121/76 127/88 107/71  Pulse: 89 (!) 58 72 84  Resp: 18 14 16 16   Temp: 98.1 F (36.7 C) 98.5 F (36.9 C) 97.8 F (36.6 C) 97.8 F (36.6 C)  TempSrc: Oral Oral Oral Oral  SpO2: 94% 97% 92% 91%  Weight:      Height:        Intake/Output Summary (Last 24 hours) at 01/24/2022 0757 Last data filed at 01/24/2022 0616 Gross per 24 hour  Intake 340 ml  Output 1250 ml  Net -910 ml   Filed Weights   01/13/22 1851  Weight: 45 kg    Examination:  General exam: Appears calm and comfortable.  Looks chronically ill and deconditioned.  Currently in wrist restraints. Respiratory system: Bilateral decreased breath sounds at bases with scattered crackles Cardiovascular system: S1 & S2 heard, Rate controlled Gastrointestinal system: Abdomen is nondistended, soft and nontender. Normal bowel sounds heard. Extremities: No cyanosis, clubbing, edema  Central nervous system: Sleepy, wakes up only very slightly, confused.  No focal neurological deficits. Moving extremities Skin: No rashes, lesions or ulcers Psychiatry: Currently not stated.  Hardly participates in any conversation.  Data Reviewed: I have personally reviewed following labs and imaging studies  CBC: Recent Labs  Lab 01/18/22 0632 01/19/22 0719 01/20/22 0557 01/21/22 0833 01/23/22 0656  WBC 8.3 8.5 7.4 8.0  7.1  HGB 9.2* 9.4* 9.2* 9.3* 8.7*  HCT 30.8* 31.3* 31.0* 31.4* 28.0*  MCV 91.4 91.5 89.9 91.0 88.3  PLT 375 348 354 339 306   Basic Metabolic Panel: Recent Labs  Lab 01/18/22 0632 01/19/22 0719 01/20/22 0557 01/21/22 0833 01/23/22 0656  NA 142 142 139 142 134*  K 4.3 3.3* 3.4* 3.9 3.9  CL 107 101 101 104 100  CO2 28 30 29 29 29   GLUCOSE 109* 121* 95 101* 99  BUN 23 25* 23 27* 36*  CREATININE 0.93 1.14* 0.90 1.02* 0.97  CALCIUM 8.9 8.9 9.0 9.0 8.6*  MG 2.2 2.2 2.1 2.2  --    GFR: Estimated Creatinine Clearance: 32.9 mL/min (by C-G formula based on SCr of 0.97 mg/dL). Liver Function Tests: No results for input(s): "AST", "ALT", "ALKPHOS", "BILITOT", "PROT", "ALBUMIN" in the last 168 hours. No results for input(s): "LIPASE", "AMYLASE" in the last 168 hours. No results for input(s): "AMMONIA" in the last 168 hours. Coagulation Profile: No results for input(s): "INR", "PROTIME" in the last 168 hours. Cardiac Enzymes: No results for input(s): "CKTOTAL", "CKMB", "CKMBINDEX", "TROPONINI" in the last 168 hours. BNP (last 3 results) No results for input(s): "PROBNP" in the last  8760 hours. HbA1C: No results for input(s): "HGBA1C" in the last 72 hours. CBG: Recent Labs  Lab 01/22/22 2355 01/23/22 0535 01/23/22 1141 01/24/22 0029 01/24/22 0607  GLUCAP 162* 106* 131* 101* 77   Lipid Profile: No results for input(s): "CHOL", "HDL", "LDLCALC", "TRIG", "CHOLHDL", "LDLDIRECT" in the last 72 hours. Thyroid Function Tests: No results for input(s): "TSH", "T4TOTAL", "FREET4", "T3FREE", "THYROIDAB" in the last 72 hours. Anemia Panel: No results for input(s): "VITAMINB12", "FOLATE", "FERRITIN", "TIBC", "IRON", "RETICCTPCT" in the last 72 hours. Sepsis Labs: No results for input(s): "PROCALCITON", "LATICACIDVEN" in the last 168 hours.  Recent Results (from the past 240 hour(s))  Urine Culture     Status: Abnormal   Collection Time: 01/17/22 12:46 PM   Specimen: Urine, Clean Catch   Result Value Ref Range Status   Specimen Description   Final    URINE, CLEAN CATCH Performed at [facility], [address] [place], [place] [address]    Special Requests   Final    NONE Performed at [facility], [address] [place], [place] [address]    Culture >=100,000 COLONIES/mL ESCHERICHIA COLI (A)  Final   Report Status 01/19/2022 FINAL  Final   Organism ID, Bacteria ESCHERICHIA COLI (A)  Final      Susceptibility   Escherichia coli - MIC*    AMPICILLIN >=32 RESISTANT Resistant     CEFAZOLIN <=4 SENSITIVE Sensitive     CEFEPIME <=0.12 SENSITIVE Sensitive     CEFTRIAXONE <=0.25 SENSITIVE Sensitive     CIPROFLOXACIN >=4 RESISTANT Resistant     GENTAMICIN <=1 SENSITIVE Sensitive     IMIPENEM <=0.25 SENSITIVE Sensitive     NITROFURANTOIN <=16 SENSITIVE Sensitive     TRIMETH/SULFA <=20 SENSITIVE Sensitive     AMPICILLIN/SULBACTAM 16 INTERMEDIATE Intermediate     PIP/TAZO <=4 SENSITIVE Sensitive     * >=100,000 COLONIES/mL ESCHERICHIA COLI  Body fluid culture w Gram Stain     Status: None (Preliminary result)   Collection Time: 01/20/22  2:33 PM   Specimen: PATH Cytology Pleural fluid  Result Value Ref Range Status   Specimen Description   Final    PLEURAL Performed at [facility], [address], [place], [place] [address]    Special Requests RIGHT  Final   Gram Stain   Final    RARE WBC PRESENT,BOTH PMN AND MONONUCLEAR NO ORGANISMS SEEN    Culture   Final    NO GROWTH 3 DAYS Performed at [facility], [address] [place], [place] [address]    Report Status PENDING  Incomplete         Radiology Studies: DG CHEST PORT 1 VIEW  Result Date: 01/22/2022 CLINICAL DATA:  Dyspnea EXAM: PORTABLE CHEST 1 VIEW COMPARISON:  01/20/2022 FINDINGS: Significant increase in size of moderate-large right pleural effusion/hemothorax. Progressive airspace opacity within the right lung. Left basilar  opacities unchanged. No pneumothorax. Numerous right-sided rib fractures. IMPRESSION: Significant increase in size of moderate-large right pleural effusion/hemothorax. Progressive airspace opacity within the right lung. Electronically Signed   By: Duanne Guess D.O.   On: 01/22/2022 15:53        Scheduled Meds:  Chlorhexidine Gluconate Cloth  6 each Topical Daily   dextromethorphan-guaiFENesin  1 tablet Oral BID   feeding supplement  237 mL Oral BID BM   melatonin  6 mg Oral QHS   memantine  10 mg Oral BID   risperiDONE  2 mg Oral QHS  sertraline  200 mg Oral q AM   traZODone  50 mg Oral QHS   Continuous Infusions:        Glade LloydKshitiz Eva Griffo, MD [facility] 01/24/2022, 7:57 AM

## 2022-01-24 NOTE — Consult Note (Signed)
Consultation Note Date: 01/24/2022   Patient Name: Diana Horton  DOB: 11/28/1940  MRN: [id number]  Age / Sex: 81 y.o., female  PCP: Pcp, No Referring Physician: Glade Lloyd, MD  Reason for Consultation: Establishing goals of care  HPI/Patient Profile: 81 y.o. female   admitted on 01/11/2022    Clinical Assessment and Goals of Care: 81 year old lady from [facility] memory unit, history of dementia osteoporosis B12 deficiency.  Admitted with multiple unwitnessed falls at her facility, found to have multiple acute right-sided rib fractures hemothorax, hospital course complicated by E. coli urinary tract infection for which the patient is given antibiotics, hospital course complicated by worsening episodic agitation, hyperactive delirium patient requiring Haldol Zyprexa.  Oral intake is minimal.  Marked escalation of symptoms such as agitation.  Remains admitted to hospital medicine service with multiple acute right rib fractures secondary to mechanical fall, has complete opacification right lung deemed secondary to combination of fluid as well as mucous plugging.  Palliative consult for goals of care discussions.  NEXT OF KIN Son Diana Horton  SUMMARY OF RECOMMENDATIONS   Consult requested received, patient seen and examined, chart reviewed, discussed with bedside RN colleague, call placed and discussed with the son Diana Horton: Palliative medicine is specialized medical care for people living with serious illness. It focuses on providing relief from the symptoms and stress of a serious illness. The goal is to improve quality of life for both the patient and the family. Goals of care: Broad aims of medical therapy in relation to the patient's values and preferences. Our aim is to provide medical care aimed at enabling patients to achieve the goals that matter most to them, given the circumstances of their  particular medical situation and their constraints.   Brief life review performed.  Goals wishes and values attempted to be explored.  Discussed about the situation pertaining to current hospitalization.  Overall, goals of care appear to be headed towards the direction of establishing comfort as a singular focus.  Hence, introduced concept of hospice.  Patient's son is familiar with hospice, his father died in hospice care, specifically at hospice home in [place], [place].  Patient's son grew up in [place], and CNA is familiar with [facility] the [place]. Plan: Proceed with more for focus on comfort measures TOC consult for residential hospice [facility] been chosen.  Code Status/Advance Care Planning: DNR   Symptom Management:     Palliative Prophylaxis:  Delirium Protocol  Psycho-social/Spiritual:  Desire for further Chaplaincy support:yes Additional Recommendations: Education on Hospice  Prognosis:  < 2 weeks  Discharge Planning: Hospice facility      Primary Diagnoses: Present on Admission:  Hemothorax, right  Hemothorax on right   I have reviewed the medical record, interviewed the patient and family, and examined the patient. The following aspects are pertinent.  Past Medical History:  Diagnosis Date   Dementia [facility])    Social History   Socioeconomic History   Marital status: Widowed  Spouse name: Not on file   Number of children: Not on file   Years of education: Not on file   Highest education level: Not on file  Occupational History   Not on file  Tobacco Use   Smoking status: Unknown   Smokeless tobacco: Never  Substance and Sexual Activity   Alcohol use: Not on file   Drug use: Not on file   Sexual activity: Not on file  Other Topics Concern   Not on file  Social History Narrative   Not on file   Social Determinants of Health   Financial Resource Strain: Not on file  Food Insecurity: Not on file   Transportation Needs: Not on file  Physical Activity: Not on file  Stress: Not on file  Social Connections: Not on file   History reviewed. No pertinent family history. Scheduled Meds:  Chlorhexidine Gluconate Cloth  6 each Topical Daily   dextromethorphan-guaiFENesin  1 tablet Oral BID   feeding supplement  237 mL Oral BID BM   melatonin  6 mg Oral QHS   memantine  10 mg Oral BID   risperiDONE  2 mg Oral QHS   sertraline  200 mg Oral q AM   traZODone  50 mg Oral QHS   Continuous Infusions: PRN Meds:.acetaminophen **OR** acetaminophen, dextrose, guaiFENesin, HYDROcodone-acetaminophen, ipratropium-albuterol, morphine, OLANZapine, ondansetron **OR** [DISCONTINUED] ondansetron (ZOFRAN) IV, polyethylene glycol, senna-docusate Medications Prior to Admission:  Prior to Admission medications   Medication Sig Start Date End Date Taking? Authorizing Provider  alendronate (FOSAMAX) 70 MG tablet Take 70 mg by mouth once a week. 05/15/20  Yes [provider]  Cholecalciferol (VITAMIN D3) 1000 units CAPS Take 1,000 Units by mouth daily.   Yes [provider]  cyanocobalamin 1000 MCG tablet Take 1,000 mcg by mouth daily. 05/09/19  Yes [provider]  folic acid (FOLVITE) 1 MG tablet Take 2 mg by mouth daily. 01/16/20  Yes [provider]  LORazepam (ATIVAN) 0.5 MG tablet Take 0.5 mg by mouth 2 (two) times daily as needed for anxiety (or agitation). 05/26/21  Yes [provider]  melatonin 3 MG TABS tablet Take 6 mg by mouth at bedtime.   Yes [provider]  memantine (NAMENDA) 10 MG tablet Take 10 mg by mouth 2 (two) times daily.   Yes [provider]  potassium chloride (KLOR-CON) 10 MEQ tablet Take 10 mEq by mouth daily.   Yes [provider]  sertraline (ZOLOFT) 100 MG tablet Take 200 mg by mouth in the morning.   Yes [provider]  traZODone (DESYREL) 50 MG tablet Take 25 mg by mouth at bedtime. 06/09/20  Yes  [provider]  memantine (NAMENDA) 5 MG tablet Take by mouth. Patient not taking: Reported on 01/11/2022 03/31/21   [provider]  potassium chloride SA (KLOR-CON M) 20 MEQ tablet Take 1 tablet (20 mEq total) by mouth daily for 5 days. Patient not taking: Reported on 01/11/2022 09/13/21 01/11/22  Elgie Congo, MD   No Known Allergies Review of Systems Confused  Physical Exam Weak appearing elderly lady Resting in bed in Patient gets confused and has been agitated earlier on Diminished breath sounds Abdomen is not distended Does not have edema  Vital Signs: BP 107/71 (BP Location: Left Arm)   Pulse 84   Temp 97.8 F (36.6 C) (Oral)   Resp 16   Ht 5' 7.5" (1.715 m)   Wt 45 kg   SpO2 91%   BMI 15.31  kg/m  Pain Scale: PAINAD   Pain Score: 0-No pain   SpO2: SpO2: 91 % O2 Device:SpO2: 91 % O2 Flow Rate: .O2 Flow Rate (L/min): 2 L/min  IO: Intake/output summary:  Intake/Output Summary (Last 24 hours) at 01/24/2022 1515 Last data filed at 01/24/2022 0616 Gross per 24 hour  Intake 340 ml  Output 1250 ml  Net -910 ml    LBM: Last BM Date : 01/15/22 Baseline Weight: Weight: 45 kg Most recent weight: Weight: 45 kg     Palliative Assessment/Data:   PPS 30%  Time In:  1415 Time Out:  1515 Time Total:  60  Greater than 50%  of this time was spent counseling and coordinating care related to the above assessment and plan.  Signed by: Loistine Chance, MD   Please contact Palliative Medicine Team phone at [phone] for questions and concerns.  For individual provider: See Shea Evans

## 2022-01-25 ENCOUNTER — Encounter (HOSPITAL_COMMUNITY): Payer: Self-pay | Admitting: Internal Medicine

## 2022-01-25 DIAGNOSIS — L8990 Pressure ulcer of unspecified site, unspecified stage: Secondary | ICD-10-CM | POA: Insufficient documentation

## 2022-01-25 DIAGNOSIS — W19XXXA Unspecified fall, initial encounter: Secondary | ICD-10-CM

## 2022-01-25 DIAGNOSIS — S2241XA Multiple fractures of ribs, right side, initial encounter for closed fracture: Secondary | ICD-10-CM | POA: Diagnosis not present

## 2022-01-25 DIAGNOSIS — Z515 Encounter for palliative care: Secondary | ICD-10-CM | POA: Diagnosis not present

## 2022-01-25 DIAGNOSIS — E43 Unspecified severe protein-calorie malnutrition: Secondary | ICD-10-CM

## 2022-01-25 DIAGNOSIS — Z66 Do not resuscitate: Secondary | ICD-10-CM

## 2022-01-25 DIAGNOSIS — Z79899 Other long term (current) drug therapy: Secondary | ICD-10-CM

## 2022-01-25 DIAGNOSIS — R52 Pain, unspecified: Secondary | ICD-10-CM

## 2022-01-25 DIAGNOSIS — J942 Hemothorax: Secondary | ICD-10-CM | POA: Diagnosis not present

## 2022-01-25 DIAGNOSIS — R451 Restlessness and agitation: Secondary | ICD-10-CM

## 2022-01-25 LAB — CBC WITH DIFFERENTIAL/PLATELET
Abs Immature Granulocytes: 0.06 10*3/uL (ref 0.00–0.07)
Basophils Absolute: 0 10*3/uL (ref 0.0–0.1)
Basophils Relative: 0 %
Eosinophils Absolute: 0.1 10*3/uL (ref 0.0–0.5)
Eosinophils Relative: 2 %
HCT: 29.5 % — ABNORMAL LOW (ref 36.0–46.0)
Hemoglobin: 8.8 g/dL — ABNORMAL LOW (ref 12.0–15.0)
Immature Granulocytes: 1 %
Lymphocytes Relative: 13 %
Lymphs Abs: 0.9 10*3/uL (ref 0.7–4.0)
MCH: 26.4 pg (ref 26.0–34.0)
MCHC: 29.8 g/dL — ABNORMAL LOW (ref 30.0–36.0)
MCV: 88.6 fL (ref 80.0–100.0)
Monocytes Absolute: 0.6 10*3/uL (ref 0.1–1.0)
Monocytes Relative: 9 %
Neutro Abs: 5.2 10*3/uL (ref 1.7–7.7)
Neutrophils Relative %: 75 %
Platelets: 308 10*3/uL (ref 150–400)
RBC: 3.33 MIL/uL — ABNORMAL LOW (ref 3.87–5.11)
RDW: 16.1 % — ABNORMAL HIGH (ref 11.5–15.5)
WBC: 6.9 10*3/uL (ref 4.0–10.5)
nRBC: 0 % (ref 0.0–0.2)

## 2022-01-25 LAB — MAGNESIUM: Magnesium: 2.5 mg/dL — ABNORMAL HIGH (ref 1.7–2.4)

## 2022-01-25 LAB — BASIC METABOLIC PANEL
Anion gap: 6 (ref 5–15)
BUN: 27 mg/dL — ABNORMAL HIGH (ref 8–23)
CO2: 30 mmol/L (ref 22–32)
Calcium: 8.6 mg/dL — ABNORMAL LOW (ref 8.9–10.3)
Chloride: 102 mmol/L (ref 98–111)
Creatinine, Ser: 0.94 mg/dL (ref 0.44–1.00)
GFR, Estimated: >60 mL/min (ref >60)
Glucose, Bld: 94 mg/dL (ref 70–99)
Potassium: 4.2 mmol/L (ref 3.5–5.1)
Sodium: 138 mmol/L (ref 135–145)

## 2022-01-25 MED ORDER — MORPHINE SULFATE 10 MG/5ML PO SOLN
2.0000 mg | ORAL | Status: DC | PRN
  Administered 2022-01-25: 2 mg via ORAL
  Filled 2022-01-25: qty 5

## 2022-01-25 MED ORDER — HALOPERIDOL LACTATE 5 MG/ML IJ SOLN: 1.0000 mg | INTRAMUSCULAR | Status: DC | PRN

## 2022-01-25 MED ORDER — HALOPERIDOL LACTATE 5 MG/ML IJ SOLN
1.0000 mg | Freq: Four times a day (QID) | INTRAMUSCULAR | Status: DC
  Filled 2022-01-25: qty 1

## 2022-01-25 MED ORDER — MORPHINE SULFATE (PF) 2 MG/ML IV SOLN: 2.0000 mg | INTRAVENOUS | Status: DC | PRN

## 2022-01-25 MED ORDER — MORPHINE SULFATE 10 MG/5ML PO SOLN: 2.0000 mg | ORAL | Status: DC | PRN

## 2022-01-25 MED ORDER — MEMANTINE HCL 10 MG PO TABS
10.0000 mg | ORAL_TABLET | Freq: Every day | ORAL | Status: DC
  Administered 2022-01-26 – 2022-01-27 (×2): 10 mg via ORAL
  Filled 2022-01-25 (×2): qty 1

## 2022-01-25 MED ORDER — MORPHINE SULFATE (PF) 2 MG/ML IV SOLN: 1.0000 mg | INTRAVENOUS | Status: DC | PRN

## 2022-01-25 MED ORDER — LORAZEPAM 2 MG/ML IJ SOLN
0.5000 mg | INTRAMUSCULAR | Status: DC | PRN
  Administered 2022-01-26 – 2022-01-27 (×3): 0.5 mg via INTRAVENOUS
  Filled 2022-01-25 (×3): qty 1

## 2022-01-25 MED ORDER — OLANZAPINE 5 MG PO TBDP
5.0000 mg | ORAL_TABLET | Freq: Two times a day (BID) | ORAL | Status: DC
  Administered 2022-01-25 – 2022-01-27 (×3): 5 mg via ORAL
  Filled 2022-01-25 (×5): qty 1

## 2022-01-25 NOTE — Progress Notes (Signed)
PROGRESS NOTE    Diana Horton  QJJ:[id number] DOB: [birth year] DOA: 01/11/2022 PCP: Pcp, No   Brief Narrative:  81 year old female with history of dementia, osteoporosis, B12 deficiency admitted for fall from [facility] memory care unit. Her fall was unwitnessed and unfortunately sustained another fall while in the hospital on 12/8. She was found to have multiple acute right-sided rib fracture with hemothorax. EDP discussed case with trauma surgery who recommended conservative management at this time and did not require any chest tube. Off-and-on, during hospitalization, patient has had increasing agitation requiring Haldol, Zyprexa and Xanax.  Patient was also treated with IV Rocephin for possible E. coli UTI.  Patient's overall condition did not improve during this hospitalization.  Family has decided to focus on comfort measures and would like to proceed with residential hospice.  Assessment & Plan:   Multiple acute right rib fracture secondary to mechanical fall Subacute/chronic right-sided hemothorax-traumatic History of osteoporosis  Complete opacification of right lung  History of recurrent fall. Urinary tract infection History of severe dementia. Intermittent agitation Acute blood loss anemia Ascending thoracic aorta aneurysm.  Plan - Patient's overall condition did not improve during this hospitalization.  Family has decided to focus on comfort measures and would like to proceed with residential hospice.  Discharge to residential hospice once bed is available   DVT prophylaxis: None for comfort measures Code Status: DNR Family Communication: None at bedside  disposition: Residential hospice.   Status is: Inpatient Remains inpatient as discussed above still residential hospice bed is available  Consultants: PCCM/palliative care  Procedures: As above  Antimicrobials: Completed course of Rocephin   Subjective: Patient seen and examined at bedside.  Poor  historian.  No seizures or agitation reported. Objective: Vitals:   01/23/22 0531 01/23/22 2032 01/24/22 0422 01/25/22 0600  BP: 121/76 127/88 107/71 102/70  Pulse: (!) 58 72 84 74  Resp: 14 16 16 14   Temp: 98.5 F (36.9 C) 97.8 F (36.6 C) 97.8 F (36.6 C) 98.3 F (36.8 C)  TempSrc: Oral Oral Oral Axillary  SpO2: 97% 92% 91% 92%  Weight:      Height:        Intake/Output Summary (Last 24 hours) at 01/25/2022 0824 Last data filed at 01/24/2022 1912 Gross per 24 hour  Intake 300 ml  Output 300 ml  Net 0 ml    Filed Weights   01/13/22 1851  Weight: 45 kg    Examination:  General exam: Appears to be in no distress.  Looks chronically ill and deconditioned.  Currently on 2 L oxygen via nasal cannula.    Data Reviewed: I have personally reviewed following labs and imaging studies  CBC: Recent Labs  Lab 01/19/22 0719 01/20/22 0557 01/21/22 0833 01/23/22 0656  WBC 8.5 7.4 8.0 7.1  HGB 9.4* 9.2* 9.3* 8.7*  HCT 31.3* 31.0* 31.4* 28.0*  MCV 91.5 89.9 91.0 88.3  PLT 348 354 339 306    Basic Metabolic Panel: Recent Labs  Lab 01/19/22 0719 01/20/22 0557 01/21/22 0833 01/23/22 0656  NA 142 139 142 134*  K 3.3* 3.4* 3.9 3.9  CL 101 101 104 100  CO2 30 29 29 29   GLUCOSE 121* 95 101* 99  BUN 25* 23 27* 36*  CREATININE 1.14* 0.90 1.02* 0.97  CALCIUM 8.9 9.0 9.0 8.6*  MG 2.2 2.1 2.2  --     GFR: Estimated Creatinine Clearance: 32.9 mL/min (by C-G formula based on SCr of 0.97 mg/dL). Liver Function Tests: No results for input(s): "  AST", "ALT", "ALKPHOS", "BILITOT", "PROT", "ALBUMIN" in the last 168 hours. No results for input(s): "LIPASE", "AMYLASE" in the last 168 hours. No results for input(s): "AMMONIA" in the last 168 hours. Coagulation Profile: No results for input(s): "INR", "PROTIME" in the last 168 hours. Cardiac Enzymes: No results for input(s): "CKTOTAL", "CKMB", "CKMBINDEX", "TROPONINI" in the last 168 hours. BNP (last 3 results) No results for  input(s): "PROBNP" in the last 8760 hours. HbA1C: No results for input(s): "HGBA1C" in the last 72 hours. CBG: Recent Labs  Lab 01/23/22 0535 01/23/22 1141 01/24/22 0029 01/24/22 0607 01/24/22 1834  GLUCAP 106* 131* 101* 77 96    Lipid Profile: No results for input(s): "CHOL", "HDL", "LDLCALC", "TRIG", "CHOLHDL", "LDLDIRECT" in the last 72 hours. Thyroid Function Tests: No results for input(s): "TSH", "T4TOTAL", "FREET4", "T3FREE", "THYROIDAB" in the last 72 hours. Anemia Panel: No results for input(s): "VITAMINB12", "FOLATE", "FERRITIN", "TIBC", "IRON", "RETICCTPCT" in the last 72 hours. Sepsis Labs: No results for input(s): "PROCALCITON", "LATICACIDVEN" in the last 168 hours.  Recent Results (from the past 240 hour(s))  Urine Culture     Status: Abnormal   Collection Time: 01/17/22 12:46 PM   Specimen: Urine, Clean Catch  Result Value Ref Range Status   Specimen Description   Final    URINE, CLEAN CATCH Performed at [facility], [address], [place], [place] [address]    Special Requests   Final    NONE Performed at [facility], [address] [place], [place] [address]    Culture >=100,000 COLONIES/mL ESCHERICHIA COLI (A)  Final   Report Status 01/19/2022 FINAL  Final   Organism ID, Bacteria ESCHERICHIA COLI (A)  Final      Susceptibility   Escherichia coli - MIC*    AMPICILLIN >=32 RESISTANT Resistant     CEFAZOLIN <=4 SENSITIVE Sensitive     CEFEPIME <=0.12 SENSITIVE Sensitive     CEFTRIAXONE <=0.25 SENSITIVE Sensitive     CIPROFLOXACIN >=4 RESISTANT Resistant     GENTAMICIN <=1 SENSITIVE Sensitive     IMIPENEM <=0.25 SENSITIVE Sensitive     NITROFURANTOIN <=16 SENSITIVE Sensitive     TRIMETH/SULFA <=20 SENSITIVE Sensitive     AMPICILLIN/SULBACTAM 16 INTERMEDIATE Intermediate     PIP/TAZO <=4 SENSITIVE Sensitive     * >=100,000 COLONIES/mL ESCHERICHIA COLI  Body fluid culture w Gram Stain     Status: None    Collection Time: 01/20/22  2:33 PM   Specimen: PATH Cytology Pleural fluid  Result Value Ref Range Status   Specimen Description   Final    PLEURAL Performed at [facility], [address] [place], [place] [address]    Special Requests RIGHT  Final   Gram Stain   Final    RARE WBC PRESENT,BOTH PMN AND MONONUCLEAR NO ORGANISMS SEEN    Culture   Final    NO GROWTH 3 DAYS Performed at [facility], [address], [place], [place] [address]    Report Status 01/24/2022 FINAL  Final         Radiology Studies: No results found.      Scheduled Meds:  Chlorhexidine Gluconate Cloth  6 each Topical Daily   dextromethorphan-guaiFENesin  1 tablet Oral BID   feeding supplement  237 mL Oral BID BM   melatonin  6 mg Oral QHS   memantine  10 mg Oral BID   OLANZapine zydis  5 mg Oral Daily   risperiDONE  2 mg Oral  QHS   sertraline  200 mg Oral q AM   traZODone  50 mg Oral QHS   Continuous Infusions:        Glade Lloyd, MD [facility] 01/25/2022, 8:24 AM

## 2022-01-25 NOTE — Progress Notes (Addendum)
Daily Progress Note   Patient Name: Diana Horton       Date: 01/25/2022 DOB: 09/21/1940  Age: 81 y.o. MRN#: [id number] Attending Physician: Glade Lloyd, MD Primary Care Physician: Pcp, No Admit Date: 01/11/2022 Length of Stay: 13 days  Reason for Consultation/Follow-up: Establishing goals of care and symptom management  Subjective:   CC: Patient is confused laying in bed with mittens.  Following up regarding complex medical decision making and symptom management.  Subjective:  As per EMR review patient transition to comfort focused care yesterday.  Per EMR review, son seeking placement with residential hospice at [facility] [place] [facility] [place].  As per EMR review patient's son stated focus is on comfort at this time.  Discussed care with bedside RN for updates.  Patient has been very agitated and yelling out.  Patient does not appear to be in pain, just agitated.  Patient is in mittens at this time.   Patient herself not able to participate in complex medical decision making.  Extensive review of medications to further assist with symptom management in the setting of comfort focused care.  Review of Systems Patient unable to participate in conversation due to medical status.  Objective:   Vital Signs:  BP 102/70 (BP Location: Left Arm)   Pulse 74   Temp 98.3 F (36.8 C) (Axillary)   Resp 14   Ht 5' 7.5" (1.715 m)   Wt 45 kg   SpO2 92%   BMI 15.31 kg/m   Physical Exam: General: Agitated, laying in bed, mittens, chronically ill-appearing, frail Eyes: No drainage noted HENT: Dry mucous membranes Cardiovascular: RRR Respiratory: no increased work of breathing noted, not in respiratory distress Abdomen: not distended Extremities: Muscle wasting present, in mittens Skin: no rashes or lesions on visible skin Neuro: Agitated, confused  Imaging:  I personally reviewed recent imaging.   Assessment & Plan:   Assessment:  Patient is an 81 year old female  with a past medical history of dementia, osteoporosis, and B12 deficiency who was admitted from [facility] memory care unit on 01/11/2022 for management of unwitnessed falls.  Patient was found to have multiple acute right-sided rib fractures with hemothorax.  Hospitalization complicated by E. coli urinary tract infection for which the patient was managed with antibiotics.  Patient has had worsening agitation with hyperactive delirium in setting of underlying dementia.  Oral intake has been minimal.  Palliative medicine team consulted to assist with complex medical decision making.  Recommendations/Plan: # Complex medical decision making/goals of care:  - Patient unable to participate in complex medical decision making due to her medical status.  -As per EMR review, patient's son/NOK, Juline Sanderford, has already discussed care with medical team and elected to transition to comfort focused care.  Son seeking placement at facility in [facility] with [facility] [place].  TOC assisting with coordination.  -Will make sure interventions that are no longer focused on comfort such as IV fluids, imaging, or lab work are discontinued.  Will instead focus on symptom management of pain, dyspnea, and agitation in the setting of end-of-life care.  -  Code Status: DNR  # Symptom management:  -Agitation, in the setting of hyperactive delirium with underlying dementia  Agitation not controlled at this time as patient continues to yell out and remains in mittens.  With the focus being on comfort measures, will adjust medications to assist with agitation management.  -Discontinue Risperdal  -Adjust olanzapine to 5 mg twice daily scheduled.  -Schedule IV  Haldol 1 mg every 6 hours.  Will also have Haldol 1 mg every 4 hours as needed for breakthrough agitation.  -Add Ativan IV 0.5 mg every 4 hours as needed for breakthrough agitation.  -As patient's oral status decreasing, will appropriately wean memantine at this  time as this medication no longer providing long-term benefit in this patient with dementia and can also increase symptom burden and pill burden.  -Discontinue Zyprexa IM as needed as IM injections can worsen agitation.   -Pain/Dyspnea, in the setting of acute rib fractures   -Discontinue Norco.   -Start morphine solution po 2 mg every 4 hours as needed   -Change IV morphine to 1 mg every 2 hours as needed for breakthrough pain or dyspnea  If patient's agitation continues despite scheduled Haldol may need to increase of Haldol versus scheduling medications for pain management. Considered scheduling Tylenol though concerned patient will be able to keep oral intake to maintain this.  # Psychosocial Support:  Angela Adam  # Discharge Planning: [facility] assisting with residential hospice referral  Discussed with: Bedside RN  Thank you for allowing the palliative care team to participate in the care Rudene Christians.  Alvester Morin, DO Palliative Care Provider PMT # [phone]  If patient remains symptomatic despite maximum doses, please call PMT at [phone] between 0700 and 1900. Outside of these hours, please call attending, as PMT does not have night coverage.

## 2022-01-25 NOTE — Progress Notes (Signed)
[facility]--Rec'd referral today to eval pt for hospice inpatient placement at [facility].  Spoke with son-Peter Doerr to determine goals-of-care and review services at [facility].  We reviewed philosophy of comfort care, team approach to care, 2 levels-of-care and financial subsidy.  He wishes to move his mother moved to [facility] as soon as a bed is available.  She has been approved by [facility] medical doctor for transfer pending bed availability.  Will update as soon as bed available.  Thank you for referral and opportunity to serve pt and family.  Colette Ribas office [phone]/referral line [phone]

## 2022-01-25 NOTE — Plan of Care (Signed)
Patient AOX1, self only, VSS throughout shift. All meds given on time as ordered crushed in ice cream. Pt had pain relieved by PRN pain med. Diminished lungs, pt taught to cough and deep breathe. Foley intact and care provided. POC maintained, will continue to monitor.   Problem: Education: Goal: Knowledge of General Education information will improve Description: Including pain rating scale, medication(s)/side effects and non-pharmacologic comfort measures Outcome: Progressing   Problem: Health Behavior/Discharge Planning: Goal: Ability to manage health-related needs will improve Outcome: Progressing   Problem: Clinical Measurements: Goal: Ability to maintain clinical measurements within normal limits will improve Outcome: Progressing Goal: Will remain free from infection Outcome: Progressing Goal: Diagnostic test results will improve Outcome: Progressing Goal: Respiratory complications will improve Outcome: Progressing Goal: Cardiovascular complication will be avoided Outcome: Progressing   Problem: Activity: Goal: Risk for activity intolerance will decrease Outcome: Progressing   Problem: Nutrition: Goal: Adequate nutrition will be maintained Outcome: Progressing   Problem: Coping: Goal: Level of anxiety will decrease Outcome: Progressing   Problem: Elimination: Goal: Will not experience complications related to bowel motility Outcome: Progressing Goal: Will not experience complications related to urinary retention Outcome: Progressing   Problem: Pain Managment: Goal: General experience of comfort will improve Outcome: Progressing   Problem: Safety: Goal: Ability to remain free from injury will improve Outcome: Progressing   Problem: Skin Integrity: Goal: Risk for impaired skin integrity will decrease Outcome: Progressing   Problem: Safety: Goal: Non-violent Restraint(s) Outcome: Progressing   

## 2022-01-25 NOTE — TOC Progression Note (Signed)
Transition of Care [facility]) - Progression Note    Patient Details  Name: Diana Horton MRN: [id number] Date of Birth: [birth year]  [facility]) CM/SW Contact  Christophe Rising, Olegario Messier, RN Phone Number: 01/25/2022, 10:10 AM  Clinical Narrative:Referral for Residential hospice-spoke to son [name] chose [facility] [place] in [place] will eval for acceptance-she already states no beds available today.will await outcome of eval.     Expected Discharge Plan: Hospice Medical Facility Barriers to Discharge: Continued Medical Work up  Expected Discharge Plan and Services   Discharge Planning Services: CM Consult   Living arrangements for the past 2 months: Assisted Living Facility (Memory Care Unit)                                       Social Determinants of Health (SDOH) Interventions SDOH Screenings   Tobacco Use: Unknown (01/13/2022)    Readmission Risk Interventions     No data to display

## 2022-01-26 DIAGNOSIS — W19XXXA Unspecified fall, initial encounter: Secondary | ICD-10-CM | POA: Diagnosis not present

## 2022-01-26 DIAGNOSIS — Z515 Encounter for palliative care: Secondary | ICD-10-CM | POA: Diagnosis not present

## 2022-01-26 DIAGNOSIS — S2241XA Multiple fractures of ribs, right side, initial encounter for closed fracture: Secondary | ICD-10-CM | POA: Diagnosis not present

## 2022-01-26 DIAGNOSIS — J942 Hemothorax: Secondary | ICD-10-CM | POA: Diagnosis not present

## 2022-01-26 LAB — GLUCOSE, CAPILLARY: Glucose-Capillary: 79 mg/dL (ref 70–99)

## 2022-01-26 MED ORDER — MORPHINE SULFATE 10 MG/5ML PO SOLN: 2.0000 mg | ORAL | Status: DC | PRN

## 2022-01-26 MED ORDER — OLANZAPINE 5 MG PO TBDP: 5.0000 mg | ORAL_TABLET | Freq: Two times a day (BID) | ORAL | Status: DC

## 2022-01-26 MED ORDER — HALOPERIDOL LACTATE 5 MG/ML IJ SOLN: 5.0000 mg | Freq: Four times a day (QID) | INTRAMUSCULAR | Status: DC | PRN

## 2022-01-26 NOTE — Plan of Care (Signed)
Patient AOX1, self only, VSS throughout shift. All meds given on time as ordered crushed in ice cream. Pt had pain relieved by PRN pain med. Diminished lungs, pt taught to cough and deep breathe. Foley intact and care provided. POC maintained, will continue to monitor.   Problem: Education: Goal: Knowledge of General Education information will improve Description: Including pain rating scale, medication(s)/side effects and non-pharmacologic comfort measures Outcome: Progressing   Problem: Health Behavior/Discharge Planning: Goal: Ability to manage health-related needs will improve Outcome: Progressing   Problem: Clinical Measurements: Goal: Ability to maintain clinical measurements within normal limits will improve Outcome: Progressing Goal: Will remain free from infection Outcome: Progressing Goal: Diagnostic test results will improve Outcome: Progressing Goal: Respiratory complications will improve Outcome: Progressing Goal: Cardiovascular complication will be avoided Outcome: Progressing   Problem: Activity: Goal: Risk for activity intolerance will decrease Outcome: Progressing   Problem: Nutrition: Goal: Adequate nutrition will be maintained Outcome: Progressing   Problem: Coping: Goal: Level of anxiety will decrease Outcome: Progressing   Problem: Elimination: Goal: Will not experience complications related to bowel motility Outcome: Progressing Goal: Will not experience complications related to urinary retention Outcome: Progressing   Problem: Pain Managment: Goal: General experience of comfort will improve Outcome: Progressing   Problem: Safety: Goal: Ability to remain free from injury will improve Outcome: Progressing   Problem: Skin Integrity: Goal: Risk for impaired skin integrity will decrease Outcome: Progressing   Problem: Safety: Goal: Non-violent Restraint(s) Outcome: Progressing

## 2022-01-26 NOTE — Progress Notes (Signed)
Daily Progress Note   Patient Name: Diana Horton       Date: 01/26/2022 DOB: [birth year]  Age: 82 y.o. MRN#: [id number] Attending Physician: Aline August, MD Primary Care Physician: Pcp, No Admit Date: 01/11/2022 Length of Stay: 14 days  Reason for Consultation/Follow-up: Establishing goals of care and symptom management  Subjective:   CC: Patient welcoming visit this morning. Following up regarding symptom management and GOC.   Subjective:  As per EMR review, patient did not receive any haldol overnight. Patient received po morphine x1 dose.   As per EMR review, [facility] reached out to son to discuss care. Still focused on comfort care approach. Patient has been accepted for transfer to facility as soon as bed is available.   Presented to bedside to check on patient. She waved and welcomed visit this morning. She expressed appreciation for all the care she has received her. She noted she wanted to watch TV and so she picked on [employer] which I put on for her. She was very pleasant during visit this morning and did not express any symptom concerns.   Review of Systems Denies any symptoms of concern this AM.  Objective:   Vital Signs:  BP 120/85 (BP Location: Left Arm)   Pulse 83   Temp 98.1 F (36.7 C) (Oral)   Resp 17   Ht 5' 7.5" (1.715 m)   Wt 45.7 kg   SpO2 90%   BMI 15.55 kg/m   Physical Exam: General: NAD, pleasant, chronically ill-appearing, frail Eyes: No drainage noted HENT: Dry mucous membranes Cardiovascular: RRR Respiratory: no increased work of breathing noted, not in respiratory distress Abdomen: not distended Extremities: Muscle wasting present Skin: no rashes or lesions on visible skin Neuro: pleasant, able to participate in simple interactions   Imaging:  I personally reviewed recent imaging.   Assessment & Plan:   Assessment:  Patient is an 82 year old female with a past medical history of dementia, osteoporosis, and B12  deficiency who was admitted from [facility] care unit on 01/11/2022 for management of unwitnessed falls.  Patient was found to have multiple acute right-sided rib fractures with hemothorax.  Hospitalization complicated by E. coli urinary tract infection for which the patient was managed with antibiotics.  Patient has had worsening agitation with hyperactive delirium in setting of underlying dementia.  Oral intake has been minimal.  Palliative medicine team consulted to assist with complex medical decision making.  Recommendations/Plan: # Complex medical decision making/goals of care:  - Patient has been accepted to [facility] in [facility]. Awaiting bed for transfer. Continuing with comfort focused care while in hospital.   -  Code Status: DNR  # Symptom management:  -Agitation, in the setting of hyperactive delirium with underlying dementia   -Continue olanzapine to 5 mg twice daily scheduled.  -Discontinued IV Haldol 1 mg every 6 hours.  Continue Haldol 1 mg every 4 hours as needed for breakthrough agitation.  -Continue Ativan IV 0.5 mg every 4 hours as needed for breakthrough agitation.  -As patient's oral status decreasing, will appropriately wean memantine at this time as this medication no longer providing long-term benefit in this patient with dementia and can also increase symptom burden and pill burden. Can be discontinued withy transferred to hospice home.   -Please don't restart Zyprexa IM as needed as IM injections can worsen agitation.   -Pain/Dyspnea, in the setting of acute rib fractures   -Please do not restart Norco.   -  Continue morphine solution po 2 mg every 4 hours as needed   -Continue IV morphine to 1 mg every 2 hours as needed for breakthrough pain or dyspnea   # Psychosocial Support:  [facility]  # Discharge Planning: Will be transferred to [facility] in [facility] when bed available.   Discussed with: Bedside RN  Thank you for allowing the  palliative care team to participate in the care Silverio Decamp.  Chelsea Aus, DO Palliative Care Provider PMT # [phone]  If patient remains symptomatic despite maximum doses, please call PMT at [phone] between 0700 and 1900. Outside of these hours, please call attending, as PMT does not have night coverage.  This provider spent a total of 36 minutes providing patient's care.  Includes review of EMR, discussing care with other staff members involved in patient's medical care, obtaining relevant history and information from patient and/or patient's family, and personal review of imaging and lab work. Greater than 50% of the time was spent counseling and coordinating care related to the above assessment and plan.

## 2022-01-26 NOTE — Discharge Summary (Signed)
Physician Discharge Summary  Diana Horton [id number] DOB: 05-Aug-1940 DOA: 01/11/2022  PCP: Merryl Hacker, No  Admit date: 01/11/2022 Discharge date: 01/26/2022  Admitted From: ALF Disposition: Residential hospice  Recommendations for Outpatient Follow-up:  Follow up with residential hospice provider at earliest [place]: No Equipment/Devices: None  Discharge Condition: Poor CODE STATUS: DNR Diet recommendation: As per comfort measures  Brief/Interim Summary: 82 year old female with history of dementia, osteoporosis, B12 deficiency admitted for fall from [facility] memory care unit. Her fall was unwitnessed and unfortunately sustained another fall while in the hospital on 12/8. She was found to have multiple acute right-sided rib fracture with hemothorax. EDP discussed case with trauma surgery who recommended conservative management at this time and did not require any chest tube. Off-and-on, during hospitalization, patient has had increasing agitation requiring Haldol, Zyprexa and Xanax.  Patient was also treated with IV Rocephin for possible E. coli UTI.  Patient's overall condition did not improve during this hospitalization.  Family has decided to focus on comfort measures and would like to proceed with residential hospice.  Discharge to residential hospice once bed is available.  Discharge Diagnoses:   Multiple acute right rib fracture secondary to mechanical fall Subacute/chronic right-sided hemothorax-traumatic History of osteoporosis  Complete opacification of right lung  History of recurrent fall. Urinary tract infection History of severe dementia. Intermittent agitation Acute blood loss anemia Ascending thoracic aorta aneurysm.   Plan - Patient's overall condition did not improve during this hospitalization.  Family has decided to focus on comfort measures and would like to proceed with residential hospice.  Discharge to residential hospice once bed is  available    Discharge Instructions   Allergies as of 01/26/2022   No Known Allergies      Medication List     STOP taking these medications    alendronate 70 MG tablet Commonly known as: FOSAMAX   cyanocobalamin [id number] MCG tablet   folic acid 1 MG tablet Commonly known as: FOLVITE   melatonin 3 MG Tabs tablet   potassium chloride 10 MEQ tablet Commonly known as: KLOR-CON   potassium chloride SA 20 MEQ tablet Commonly known as: KLOR-CON M   traZODone 50 MG tablet Commonly known as: DESYREL   Vitamin D3 1000 units Caps       TAKE these medications    LORazepam 0.5 MG tablet Commonly known as: ATIVAN Take 0.5 mg by mouth 2 (two) times daily as needed for anxiety (or agitation).   memantine 5 MG tablet Commonly known as: NAMENDA Take by mouth. What changed: Another medication with the same name was removed. Continue taking this medication, and follow the directions you see here.   morphine 10 MG/5ML solution Take 1 mL (2 mg total) by mouth every 4 (four) hours as needed for severe pain or moderate pain (dyspnea).   OLANZapine zydis 5 MG disintegrating tablet Commonly known as: ZYPREXA Take 1 tablet (5 mg total) by mouth 2 (two) times daily.   sertraline 100 MG tablet Commonly known as: ZOLOFT Take 200 mg by mouth in the morning.        No Known Allergies  Consultations: PCCM/palliative care   Procedures/Studies: DG CHEST PORT 1 VIEW  Result Date: 01/22/2022 CLINICAL DATA:  Dyspnea EXAM: PORTABLE CHEST 1 VIEW COMPARISON:  01/20/2022 FINDINGS: Significant increase in size of moderate-large right pleural effusion/hemothorax. Progressive airspace opacity within the right lung. Left basilar opacities unchanged. No pneumothorax. Numerous right-sided rib fractures. IMPRESSION: Significant increase in size of moderate-large  right pleural effusion/hemothorax. Progressive airspace opacity within the right lung. Electronically Signed   By: Davina Poke  D.O.   On: 01/22/2022 15:53   US THORACENTESIS ASP PLEURAL SPACE W/IMG GUIDE  Result Date: 01/20/2022 INDICATION: Patient with history of dementia, recent fall with rib fractures, right lung collapse with right pleural effusion/hemothorax; request received for diagnostic and therapeutic right thoracentesis EXAM: ULTRASOUND GUIDED DIAGNOSTIC AND THERAPEUTIC RIGHT THORACENTESIS MEDICATIONS: 8 ml 1% lidocaine COMPLICATIONS: None immediate. PROCEDURE: An ultrasound guided thoracentesis was thoroughly discussed with the patient's son and questions answered. The benefits, risks, alternatives and complications were also discussed. The patient's son understands and wishes to proceed with the procedure. Written consent was obtained. Ultrasound was performed to localize and mark an adequate pocket of fluid in the right chest. The area was then prepped and draped in the normal sterile fashion. 1% Lidocaine was used for local anesthesia. Under ultrasound guidance a 6 Fr Safe-T-Centesis catheter was introduced. Thoracentesis was performed. The catheter was removed and a dressing applied. FINDINGS: A total of approximately 1 liter of dark,bloody fluid was removed. Samples were sent to the laboratory as requested by the clinical team. IMPRESSION: Successful ultrasound guided diagnostic and therapeutic right thoracentesis yielding 1 liter of pleural fluid. Read by: Rowe Robert, PA-C Electronically Signed   By: Markus Daft M.D.   On: 01/20/2022 16:07   DG CHEST PORT 1 VIEW  Result Date: 01/20/2022 CLINICAL DATA:  Status post right thoracentesis. EXAM: PORTABLE CHEST 1 VIEW COMPARISON:  Same day. FINDINGS: No pneumothorax is noted status post right thoracentesis. Right pleural effusion is significantly smaller. IMPRESSION: No pneumothorax status post right-sided thoracentesis. Electronically Signed   By: Marijo Conception M.D.   On: 01/20/2022 15:01   DG Chest Port 1 View  Result Date: 01/20/2022 CLINICAL DATA:  Dyspnea.   Blunt chest trauma with right hemothorax. EXAM: PORTABLE CHEST 1 VIEW COMPARISON:  01/19/2022 FINDINGS: Complete opacification of right hemithorax is again seen with shift of mediastinal structures to the left. This is consistent with a large right hemothorax. No pneumothorax visualized. Multiple right-sided rib fractures are again seen. Mild opacity in left retrocardiac lung base is unchanged, and differential diagnosis includes pulmonary contusion, pneumonia, or aspiration. IMPRESSION: No significant change in large right hemothorax, with mediastinal shift to the left. Multiple right-sided rib fractures. No pneumothorax visualized. Stable mild left retrocardiac opacity, which may be due to pulmonary contusion, pneumonia, or aspiration. Electronically Signed   By: Marlaine Hind M.D.   On: 01/20/2022 10:04   DG Chest Port 1 View  Result Date: 01/19/2022 CLINICAL DATA:  Encounter for dyspnea EXAM: PORTABLE CHEST 1 VIEW COMPARISON:  Radiograph 01/18/2022, CT 01/11/2022 FINDINGS: Unchanged complete opacification of the right hemithorax with multiple displaced right-sided rib fractures. Left basilar subsegmental atelectasis. No new airspace disease in the left lung. No evidence of pneumothorax. Prior left humerus ORIF. Unchanged cardiomediastinal silhouette. IMPRESSION: Unchanged complete opacification of the right hemithorax due to large effusion and adjacent lung atelectasis. A component of mucous plugging is possible. Unchanged multiple right-sided rib fractures. Electronically Signed   By: Maurine Simmering M.D.   On: 01/19/2022 08:35   DG Chest Port 1 View  Result Date: 01/18/2022 CLINICAL DATA:  Shortness of breath, recent fall with multiple right rib fractures and hemothorax EXAM: PORTABLE CHEST 1 VIEW COMPARISON:  Previous studies including the examination done on 01/12/2022 FINDINGS: There is interval worsening of aeration in right lung with complete opacification. Central pulmonary vessels in left lung  appear  prominent. Increased markings are seen in left parahilar region and left lower lung field. There is no pneumothorax. Recent fractures are seen in right foot, fifth, sixth, seventh and eighth ribs. There is significant displacement of fracture fragments in right eighth rib. IMPRESSION: There is interval complete opacification of right hemithorax suggesting increase in amount of right pleural effusion and complete atelectasis of right lung. Possibility of obstruction caused by mucous plugs should be considered. There are patchy infiltrates in left parahilar region and left lower lung fields suggesting multifocal atelectasis/pneumonia. Possible small left pleural effusion. There is no pneumothorax. Multiple recent right rib fractures. These results will be called to the ordering clinician or representative by the Radiologist Assistant, and communication documented in the PACS or [employer]. Electronically Signed   By: Elmer Picker M.D.   On: 01/18/2022 18:27   DG Lumbar Spine 1 View  Result Date: 01/12/2022 CLINICAL DATA:  Lower back pain. 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 Severe left L1-2 and L2-3, moderate to severe left and moderate right L3-4, severe right L4-5, and severe diffuse L5-S1 disc space narrowing. Moderate T11-12 and T12-L1 disc space narrowing. Multilevel endplate sclerosis and peripheral osteophytes. Partial visualization of total left hip arthroplasty hardware and minimal visualization of right proximal femoral cephalomedullary nail. IMPRESSION: 1. 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 2. Severe multilevel degenerative disc and endplate changes. Electronically Signed   By: Yvonne Kendall M.D.   On: 01/12/2022 18:30   DG CHEST PORT 1 VIEW  Result Date: 01/12/2022 CLINICAL DATA:  Shortness of breath.  Lower back pain. EXAM: PORTABLE CHEST 1 VIEW COMPARISON:  AP  chest 06/12/2021 at 10:34 a.m., CT chest, abdomen, and pelvis 01/11/2022, AP chest 01/11/2022, chest two views 12/11/2021 FINDINGS: Redemonstration of homogeneous opacification of the inferior 2/3 of the right hemithorax, the large right pleural effusion as seen on prior radiographs and CT. Multiple displaced posterior right mid to lower rib fractures are again seen. No pneumothorax. Patchy left basilar lung opacities are similar to prior. Cardiac silhouette and mediastinal contours are unchanged with mild enlargement of the cardiac silhouette. Mild-to-moderate calcification within the aortic arch. IMPRESSION: 1. Redemonstration of large right pleural effusion and multiple displaced posterior right mid to lower rib fractures. 2. Unchanged left basilar lung opacities. Electronically Signed   By: Yvonne Kendall M.D.   On: 01/12/2022 18:29   DG HIP UNILAT WITH PELVIS 2-3 VIEWS LEFT  Result Date: 01/12/2022 CLINICAL DATA:  Lower back and left hip pain after fall. EXAM: DG HIP (WITH OR WITHOUT PELVIS) 2-3V LEFT COMPARISON:  Pelvis and left hip radiographs 01/11/2022, CT abdomen and pelvis 01/11/2022 FINDINGS: There is diffuse decreased bone mineralization. Postsurgical changes are again seen of right cephalomedullary nail fixation of a subacute to chronic distal right femoral neck fracture. Postsurgical changes are seen of total left hip arthroplasty. No perihardware lucency is seen to indicate hardware failure or loosening. Mild bilateral sacroiliac subchondral sclerosis. Moderate pubic symphysis joint space narrowing and mild peripheral degenerative osteophytes. Severe right L4-5 disc space narrowing and peripheral endplate osteophytes. Mild vascular phleboliths overlie the pelvis. IMPRESSION: Postsurgical changes of right cephalomedullary nail fixation of a subacute to chronic distal right femoral neck fracture and total left hip arthroplasty. No acute fracture or evidence of hardware failure. Electronically  Signed   By: Yvonne Kendall M.D.   On: 01/12/2022 18:24   US THORACENTESIS ASP PLEURAL SPACE W/IMG GUIDE  Result  Date: 01/12/2022 INDICATION: Hemothorax, trauma EXAM: ULTRASOUND GUIDED RIGHT THORACENTESIS MEDICATIONS: None. COMPLICATIONS: None immediate. PROCEDURE: An ultrasound guided thoracentesis was thoroughly discussed with the patient and patient's son and questions answered. The benefits, risks, alternatives and complications were also discussed. The patient understands and wishes to proceed with the procedure. Written consent was obtained. Ultrasound was performed to localize and mark an adequate pocket of fluid in the right chest. The area was then prepped and draped in the normal sterile fashion. 1% Lidocaine was used for local anesthesia. Under ultrasound guidance a 6 Fr Safe-T-Centesis catheter was introduced. Thoracentesis was performed. The catheter was removed and a dressing applied. FINDINGS: A total of approximately 150cc of bloody fluid was removed. Samples were sent to the laboratory as requested by the clinical team. IMPRESSION: Successful ultrasound guided right thoracentesis yielding 150cc of pleural fluid. Performed and dictated by Pasty Spillers, PA-C Electronically Signed   By: Corrie Mckusick D.O.   On: 01/12/2022 11:01   DG CHEST PORT 1 VIEW  Result Date: 01/12/2022 CLINICAL DATA:  Status post thoracentesis. EXAM: PORTABLE CHEST 1 VIEW COMPARISON:  Previous exams including RIGHT chest ultrasound from earlier same day and most recent chest x-ray dated 01/11/2022. FINDINGS: RIGHT pleural effusion, not significantly changed in appearance on today's chest x-ray. Associated perihilar edema. Stable patchy opacities at the LEFT lung base, likely atelectasis. No pneumothorax is seen. Heart size and mediastinal contours appear stable. IMPRESSION: 1. RIGHT pleural effusion, not significantly changed in appearance on today's chest x-ray. However, ultrasound-guided thoracentesis images from  earlier same day showed removal of 120 cc. 2. Associated RIGHT perihilar edema. 3. No pneumothorax seen. Electronically Signed   By: Franki Cabot M.D.   On: 01/12/2022 10:45   CT Cervical Spine Wo Contrast  Result Date: 01/11/2022 CLINICAL DATA:  82 year old female with fall. EXAM: CT CERVICAL SPINE WITHOUT CONTRAST TECHNIQUE: Multidetector CT imaging of the cervical spine was performed without intravenous contrast. Multiplanar CT image reconstructions were also generated. RADIATION DOSE REDUCTION: This exam was performed according to the departmental dose-optimization program which includes automated exposure control, adjustment of the mA and/or kV according to patient size and/or use of iterative reconstruction technique. COMPARISON:  12/11/2021 CT and prior studies FINDINGS: Alignment: 2 mm anterolisthesis of C3 on C4 is unchanged. No acute subluxation noted. Straightening of the normal cervical lordosis is again identified. Skull base and vertebrae: No acute fracture. No primary bone lesion or focal pathologic process. Soft tissues and spinal canal: No prevertebral fluid or swelling. No visible canal hematoma. Disc levels: Moderate degenerative disc disease/spondylosis from C4-C7 again identified. Multilevel facet arthropathy again noted, LEFT-greater-than-RIGHT. Upper chest: A large RIGHT pleural effusion is noted. Other: A probable nondisplaced acute fracture of the posterior RIGHT 5th rib noted. A nondisplaced healing fracture of the RIGHT scapular body is present. IMPRESSION: 1. No evidence of acute injury to the cervical spine. Degenerative changes as described. 2. Probable posterior RIGHT 5th rib fracture. Large RIGHT pleural effusion. 3. Healing RIGHT scapular body fracture. Electronically Signed   By: Margarette Canada M.D.   On: 01/11/2022 15:42   CT Chest W Contrast  Result Date: 01/11/2022 CLINICAL DATA:  Chest and abdominal trauma. Unwitnessed fall. Multiple recent falls. EXAM: CT CHEST,  ABDOMEN, AND PELVIS WITH CONTRAST TECHNIQUE: Multidetector CT imaging of the chest, abdomen and pelvis was performed following the standard protocol during bolus administration of intravenous contrast. RADIATION DOSE REDUCTION: This exam was performed according to the departmental dose-optimization program which includes automated exposure control, adjustment  of the mA and/or kV according to patient size and/or use of iterative reconstruction technique. CONTRAST:  OMNIPAQUE IOHEXOL 300 MG/ML  SOLN COMPARISON:  One-view chest x-ray 01/11/2022. Two-view chest x-ray 12/11/2021. FINDINGS: CT CHEST FINDINGS Cardiovascular: The heart is enlarged. Mediastinum is slightly shifted to the left. Ascending aorta is enlarged measuring 42 mm. Great vessel origins are within normal limits. Atherosclerotic changes are present. No acute aortic injury is present. Pulmonary arteries are within normal limits bilaterally. Mediastinum/Nodes: No enlarged mediastinal, hilar, or axillary lymph nodes. Thyroid gland and trachea demonstrate no significant findings. Esophagus is dilated. No discrete mass lesion is present. Lungs/Pleura: A large right pleural effusion is present. Subtle increased density is noted laterally. The majority of the effusion is water density. No significant left pleural effusion is present. Most of the right lower lobe is collapsed. Portion of the right middle lobe is collapsed. Patchy ground-glass attenuation is present the left base. Anterior ground-glass attenuation is present left upper lobe, centered about image 36 of series 6. No pneumothorax is present. Central airways are patent. Musculoskeletal: Multiple right-sided rib fractures are present. Mildly displaced lateral fractures are present at the right fourth, fifth and sixth ribs. A more significantly displaced posterolateral right seventh and eighth rib fractures are present. Slightly higher density is present about these 2 fractures which may  represent more acute hemorrhage in the pleural effusion. Posterior fractures are also present beginning at the right fifth rib. Seventh posterior rib fracture is more displaced comminuted fractures are present the posterior right ninth and tenth ribs. Some higher density material is present about the posterior comminuted fractures as well. A superior endplate T7 fracture is stable from a chest x-ray 12/11/2021 CT ABDOMEN PELVIS FINDINGS Hepatobiliary: Multiple benign cysts are again seen. No acute hepatic injury is present. Common bile duct and gallbladder are within normal limits. Pancreas: Unremarkable. No pancreatic ductal dilatation or surrounding inflammatory changes. Spleen: No splenic injury or perisplenic hematoma. Adrenals/Urinary Tract: Adrenal glands are normal bilaterally. Kidneys and ureters are within normal limits. No stone or mass lesion is present. No obstruction is present. The urinary bladder is within normal limits. It is somewhat obscured by artifact from hip replacements. Stomach/Bowel: Stomach and duodenum are within normal limits. Small bowel is unremarkable. Colon is within normal limits. Vascular/Lymphatic: Atherosclerotic calcifications are present the aorta and branch vessels. No aneurysm is present. Reproductive: Uterus is not discretely visualized. May be obscured by the artifact or surgically removed. Other: No free fluid or free air is present. Patient is markedly cachectic. Musculoskeletal: Marked rightward curvature is present in the lumbar spine centered at L1-2. Associated degenerative changes are present. No acute fractures are present the pelvis. Scratched at no acute fractures are present lumbar spine. Bony pelvis is unremarkable. 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 IMPRESSION: 1. Multiple right-sided rib fractures as described. 2. Large right pleural effusion with near complete collapse of the right lower  lobe and partial collapse of the right middle lobe. 3. Subtle increased density about the more inferolateral and posterior right-sided rib fractures may represent more acute hemorrhage in the pleural effusion. Effusion is otherwise near complete water density 4. No pneumothorax. 5. Stable superior endplate T7 fracture. 6. No acute trauma to the abdomen or pelvis. 7. 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 Recommend semi-annual imaging followup by CTA or MRA and referral to cardiothoracic surgery if not already obtained. This recommendation follows 2010 ACCF/AHA/AATS/ACR/ASA/SCA/SCAI/SIR/STS/SVM Guidelines for the Diagnosis and  Management of Patients With Thoracic Aortic Disease. Circulation. 2010; 121: J696-V89. Aortic aneurysm NOS (ICD10-I71.9) These results were called by telephone at the time of interpretation on 01/11/2022 at 3:31 pm to provider [facility] , who verbally acknowledged these results. Electronically Signed   By: San Morelle M.D.   On: 01/11/2022 15:31   CT ABDOMEN PELVIS W CONTRAST  Result Date: 01/11/2022 CLINICAL DATA:  Chest and abdominal trauma. Unwitnessed fall. Multiple recent falls. EXAM: CT CHEST, ABDOMEN, AND PELVIS WITH CONTRAST TECHNIQUE: Multidetector CT imaging of the chest, abdomen and pelvis was performed following the standard protocol during bolus administration of intravenous contrast. RADIATION DOSE REDUCTION: This exam was performed according to the departmental dose-optimization program which includes automated exposure control, adjustment of the mA and/or kV according to patient size and/or use of iterative reconstruction technique. CONTRAST:  144mL OMNIPAQUE IOHEXOL 300 MG/ML  SOLN COMPARISON:  One-view chest x-ray 01/11/2022. Two-view chest x-ray 12/11/2021. FINDINGS: CT CHEST FINDINGS Cardiovascular: The heart is enlarged. Mediastinum is slightly shifted to the left. Ascending aorta is enlarged measuring 42 mm. Great vessel origins are within normal  limits. Atherosclerotic changes are present. No acute aortic injury is present. Pulmonary arteries are within normal limits bilaterally. Mediastinum/Nodes: No enlarged mediastinal, hilar, or axillary lymph nodes. Thyroid gland and trachea demonstrate no significant findings. Esophagus is dilated. No discrete mass lesion is present. Lungs/Pleura: A large right pleural effusion is present. Subtle increased density is noted laterally. The majority of the effusion is water density. No significant left pleural effusion is present. Most of the right lower lobe is collapsed. Portion of the right middle lobe is collapsed. Patchy ground-glass attenuation is present the left base. Anterior ground-glass attenuation is present left upper lobe, centered about image 36 of series 6. No pneumothorax is present. Central airways are patent. Musculoskeletal: Multiple right-sided rib fractures are present. Mildly displaced lateral fractures are present at the right fourth, fifth and sixth ribs. A more significantly displaced posterolateral right seventh and eighth rib fractures are present. Slightly higher density is present about these 2 fractures which may represent more acute hemorrhage in the pleural effusion. Posterior fractures are also present beginning at the right fifth rib. Seventh posterior rib fracture is more displaced comminuted fractures are present the posterior right ninth and tenth ribs. Some higher density material is present about the posterior comminuted fractures as well. A superior endplate T7 fracture is stable from a chest x-ray 12/11/2021 CT ABDOMEN PELVIS FINDINGS Hepatobiliary: Multiple benign cysts are again seen. No acute hepatic injury is present. Common bile duct and gallbladder are within normal limits. Pancreas: Unremarkable. No pancreatic ductal dilatation or surrounding inflammatory changes. Spleen: No splenic injury or perisplenic hematoma. Adrenals/Urinary Tract: Adrenal glands are normal  bilaterally. Kidneys and ureters are within normal limits. No stone or mass lesion is present. No obstruction is present. The urinary bladder is within normal limits. It is somewhat obscured by artifact from hip replacements. Stomach/Bowel: Stomach and duodenum are within normal limits. Small bowel is unremarkable. Colon is within normal limits. Vascular/Lymphatic: Atherosclerotic calcifications are present the aorta and branch vessels. No aneurysm is present. Reproductive: Uterus is not discretely visualized. May be obscured by the artifact or surgically removed. Other: No free fluid or free air is present. Patient is markedly cachectic. Musculoskeletal: Marked rightward curvature is present in the lumbar spine centered at L1-2. Associated degenerative changes are present. No acute fractures are present the pelvis. Scratched at no acute fractures are present lumbar spine. Bony pelvis is unremarkable.  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 IMPRESSION: 1. Multiple right-sided rib fractures as described. 2. Large right pleural effusion with near complete collapse of the right lower lobe and partial collapse of the right middle lobe. 3. Subtle increased density about the more inferolateral and posterior right-sided rib fractures may represent more acute hemorrhage in the pleural effusion. Effusion is otherwise near complete water density 4. No pneumothorax. 5. Stable superior endplate T7 fracture. 6. No acute trauma to the abdomen or pelvis. 7. 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 Recommend semi-annual imaging followup by CTA or MRA and referral to cardiothoracic surgery if not already obtained. This recommendation follows 2010 ACCF/AHA/AATS/ACR/ASA/SCA/SCAI/SIR/STS/SVM Guidelines for the Diagnosis and Management of Patients With Thoracic Aortic Disease. Circulation. 2010; [id number]. 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 Electronically Signed   By: San Morelle M.D.   On: 01/11/2022 15:31   DG Chest Port 1 View  Result Date: 01/11/2022 CLINICAL DATA:  Unwitnessed fall EXAM: PORTABLE CHEST 1 VIEW COMPARISON:  12/11/2021 FINDINGS: Stable cardiomegaly. Aortic atherosclerosis. Moderate-large right pleural effusion with associated right basilar opacity. Left lung appears clear. No pneumothorax. Multiple acute right-sided rib fractures including the posterolateral right fourth through eighth ribs, several of which are moderately displaced. The right seventh rib is fractured in 2 places. IMPRESSION: 1. Multiple acute right-sided rib fractures. No pneumothorax is identified. 2. Moderate-large right pleural effusion may represent hemothorax in the setting of trauma. Further assessment with dedicated CT of the chest is recommended. Electronically Signed   By: Davina Poke D.O.   On: 01/11/2022 13:37   CT Head Wo Contrast  Result Date: 01/11/2022 CLINICAL DATA:  82 year old female status post unwitnessed fall. EXAM: CT HEAD WITHOUT CONTRAST TECHNIQUE: Contiguous axial images were obtained from the base of the skull through the vertex without intravenous contrast. RADIATION DOSE REDUCTION: This exam was performed according to the departmental dose-optimization program which includes automated exposure control, adjustment of the mA and/or kV according to patient size and/or use of iterative reconstruction technique. COMPARISON:  Head CT 12/11/2021. FINDINGS: Brain: Stable cerebral volume. No midline shift, ventriculomegaly, mass effect, evidence of mass lesion, intracranial hemorrhage or evidence of cortically based acute infarction. Patchy and confluent bilateral cerebral white matter hypodensity appears stable. Vascular: Mild Calcified atherosclerosis at the skull base. No suspicious  intracranial vascular hyperdensity. Skull: No acute osseous abnormality identified. Sinuses/Orbits: Visualized paranasal sinuses and mastoids are clear. Other: Mild left posterior convexity scalp hematoma on series 3, image 26. Underlying calvarium appears stable and intact. No scalp soft tissue gas. Stable orbits. IMPRESSION: 1. Left posterior convexity scalp hematoma without underlying skull fracture. 2. No acute intracranial abnormality. Stable mild to moderate for age cerebral white matter changes. Electronically Signed   By: Genevie Ann M.D.   On: 01/11/2022 12:55   DG Hip Unilat With Pelvis 2-3 Views Left  Result Date: 01/11/2022 CLINICAL DATA:  Fall.  Left hip pain EXAM: DG HIP (WITH OR WITHOUT PELVIS) 2-3V LEFT COMPARISON:  09/13/2021 FINDINGS: Status post left total hip arthroplasty. Arthroplasty components are in their expected alignment without periprosthetic lucency or fracture. Prior right femoral ORIF without apparent complication. Pelvic bony ring intact without evidence of fracture or diastasis. Severe lower lumbar spondylosis. No appreciable soft tissue abnormality. IMPRESSION: Negative. Electronically Signed   By: Davina Poke  D.O.   On: 01/11/2022 12:51      Subjective: Patient seen and examined at bedside.  Poor historian but looks comfortable.  Awake.  No agitation or seizures reported.  Discharge Exam: Vitals:   01/25/22 0600 01/26/22 0613  BP: 102/70 120/85  Pulse: 74 83  Resp: 14 17  Temp: 98.3 F (36.8 C) 98.1 F (36.7 C)  SpO2: 92% 90%    General: Chronically ill and deconditioned.  Elderly female lying in bed.  On room air currently.  Awake.  In no distress.  The results of significant diagnostics from this hospitalization (including imaging, microbiology, ancillary and laboratory) are listed below for reference.     Microbiology: Recent Results (from the past 240 hour(s))  Urine Culture     Status: Abnormal   Collection Time: 01/17/22 12:46 PM   Specimen:  Urine, Clean Catch  Result Value Ref Range Status   Specimen Description   Final    URINE, CLEAN CATCH Performed at [facility], [place] [address] [place], [place] [address]    Special Requests   Final    NONE Performed at [facility], [place] [address] [place], [place] [address]    Culture >=100,000 COLONIES/mL ESCHERICHIA COLI (A)  Final   Report Status 01/19/2022 FINAL  Final   Organism ID, Bacteria ESCHERICHIA COLI (A)  Final      Susceptibility   Escherichia coli - MIC*    AMPICILLIN >=32 RESISTANT Resistant     CEFAZOLIN <=4 SENSITIVE Sensitive     CEFEPIME <=0.12 SENSITIVE Sensitive     CEFTRIAXONE <=0.25 SENSITIVE Sensitive     CIPROFLOXACIN >=4 RESISTANT Resistant     GENTAMICIN <=1 SENSITIVE Sensitive     IMIPENEM <=0.25 SENSITIVE Sensitive     NITROFURANTOIN <=16 SENSITIVE Sensitive     TRIMETH/SULFA <=20 SENSITIVE Sensitive     AMPICILLIN/SULBACTAM 16 INTERMEDIATE Intermediate     PIP/TAZO <=4 SENSITIVE Sensitive     * >=100,000 COLONIES/mL ESCHERICHIA COLI  Body fluid culture w Gram Stain     Status: None   Collection Time: 01/20/22  2:33 PM   Specimen: PATH Cytology Pleural fluid  Result Value Ref Range Status   Specimen Description   Final    PLEURAL Performed at [place] [address], [place], [place] [address]    Special Requests RIGHT  Final   Gram Stain   Final    RARE WBC PRESENT,BOTH PMN AND MONONUCLEAR NO ORGANISMS SEEN    Culture   Final    NO GROWTH 3 DAYS Performed at [facility], [place] [address] [place], [place] [address]    Report Status 01/24/2022 FINAL  Final     Labs: BNP (last 3 results) Recent Labs    01/16/22 0545 01/19/22 0719  BNP 519.6* [id number]*   Basic Metabolic Panel: Recent Labs  Lab 01/20/22 0557 01/21/22 0833 01/23/22 0656 01/25/22 0747  NA 139 142 134* 138  K 3.4* 3.9 3.9 4.2  CL 101 104 100 102  CO2 29 29 29 30   GLUCOSE 95 101* 99 94  BUN 23  27* 36* 27*  CREATININE 0.90 1.02* 0.97 0.94  CALCIUM 9.0 9.0 8.6* 8.6*  MG 2.1 2.2  --  2.5*   Liver Function Tests: No results for input(s): "AST", "ALT", "ALKPHOS", "BILITOT", "PROT", "ALBUMIN" in the last 168 hours. No results for input(s): "LIPASE", "AMYLASE" in the last 168 hours. No results for input(s): "AMMONIA" in the last 168 hours.  CBC: Recent Labs  Lab 01/20/22 0557 01/21/22 0833 01/23/22 0656 01/25/22 0747  WBC 7.4 8.0 7.1 6.9  NEUTROABS  --   --   --  5.2  HGB 9.2* 9.3* 8.7* 8.8*  HCT 31.0* 31.4* 28.0* 29.5*  MCV 89.9 91.0 88.3 88.6  PLT 354 339 306 308   Cardiac Enzymes: No results for input(s): "CKTOTAL", "CKMB", "CKMBINDEX", "TROPONINI" in the last 168 hours. BNP: Invalid input(s): "POCBNP" CBG: Recent Labs  Lab 01/23/22 1141 01/24/22 0029 01/24/22 0607 01/24/22 1834 01/26/22 0623  GLUCAP 131* 101* 77 96 79   D-Dimer No results for input(s): "DDIMER" in the last 72 hours. Hgb A1c No results for input(s): "HGBA1C" in the last 72 hours. Lipid Profile No results for input(s): "CHOL", "HDL", "LDLCALC", "TRIG", "CHOLHDL", "LDLDIRECT" in the last 72 hours. Thyroid function studies No results for input(s): "TSH", "T4TOTAL", "T3FREE", "THYROIDAB" in the last 72 hours.  Invalid input(s): "FREET3" Anemia work up No results for input(s): "VITAMINB12", "FOLATE", "FERRITIN", "TIBC", "IRON", "RETICCTPCT" in the last 72 hours. Urinalysis    Component Value Date/Time   COLORURINE YELLOW 01/16/2022 1026   APPEARANCEUR HAZY (A) 01/16/2022 1026   LABSPEC 1.011 01/16/2022 1026   PHURINE 5.0 01/16/2022 1026   GLUCOSEU NEGATIVE 01/16/2022 1026   HGBUR SMALL (A) 01/16/2022 1026   BILIRUBINUR NEGATIVE 01/16/2022 1026   KETONESUR NEGATIVE 01/16/2022 1026   PROTEINUR NEGATIVE 01/16/2022 1026   NITRITE POSITIVE (A) 01/16/2022 1026   LEUKOCYTESUR LARGE (A) 01/16/2022 1026   Sepsis Labs Recent Labs  Lab 01/20/22 0557 01/21/22 0833 01/23/22 0656 01/25/22 0747   WBC 7.4 8.0 7.1 6.9   Microbiology Recent Results (from the past 240 hour(s))  Urine Culture     Status: Abnormal   Collection Time: 01/17/22 12:46 PM   Specimen: Urine, Clean Catch  Result Value Ref Range Status   Specimen Description   Final    URINE, CLEAN CATCH Performed at [facility], [place] [address], [place], [place] [address]    Special Requests   Final    NONE Performed at [facility], [place] [address], [place], [place] [address]    Culture >=100,000 COLONIES/mL ESCHERICHIA COLI (A)  Final   Report Status 01/19/2022 FINAL  Final   Organism ID, Bacteria ESCHERICHIA COLI (A)  Final      Susceptibility   Escherichia coli - MIC*    AMPICILLIN >=32 RESISTANT Resistant     CEFAZOLIN <=4 SENSITIVE Sensitive     CEFEPIME <=0.12 SENSITIVE Sensitive     CEFTRIAXONE <=0.25 SENSITIVE Sensitive     CIPROFLOXACIN >=4 RESISTANT Resistant     GENTAMICIN <=1 SENSITIVE Sensitive     IMIPENEM <=0.25 SENSITIVE Sensitive     NITROFURANTOIN <=16 SENSITIVE Sensitive     TRIMETH/SULFA <=20 SENSITIVE Sensitive     AMPICILLIN/SULBACTAM 16 INTERMEDIATE Intermediate     PIP/TAZO <=4 SENSITIVE Sensitive     * >=100,000 COLONIES/mL ESCHERICHIA COLI  Body fluid culture w Gram Stain     Status: None   Collection Time: 01/20/22  2:33 PM   Specimen: PATH Cytology Pleural fluid  Result Value Ref Range Status   Specimen Description   Final    PLEURAL Performed at [facility] [address] [place], [place] [address]    Special Requests RIGHT  Final   Gram Stain   Final    RARE WBC PRESENT,BOTH PMN AND MONONUCLEAR NO ORGANISMS SEEN    Culture   Final    NO  GROWTH 3 DAYS Performed at [facility], [place] [address], [place], [place] [address]    Report Status 01/24/2022 FINAL  Final     Time coordinating discharge: 35 minutes  SIGNED:   Aline August, MD  [facility] 01/26/2022, 8:20 AM

## 2022-01-27 ENCOUNTER — Encounter (HOSPITAL_COMMUNITY): Payer: Self-pay | Admitting: Internal Medicine

## 2022-01-27 NOTE — Progress Notes (Signed)
Called report to a  nurse at [facility] .

## 2022-01-27 NOTE — Progress Notes (Signed)
Patient is waiting to be discharged to residential hospice.  Please refer to the discharge summary done by me on 01/26/2022 for full details.  Patient seen and examined at bedside.

## 2022-01-27 NOTE — Care Management Important Message (Signed)
Important Message  Patient Details No IM given Hospice. Name: KASH MOTHERSHEAD MRN: [id number] Date of Birth: 04/13/1940   Medicare Important Message Given:  No     Kerin Salen 01/27/2022, 9:31 AM

## 2022-01-27 NOTE — TOC Transition Note (Addendum)
Transition of Care [facility]) - CM/SW Discharge Note   Patient Details  Name: Diana Horton MRN: [id number] Date of Birth: November 05, 1940  [facility] CM/SW Contact:  Angelita Ingles, RN Phone [phone]  01/27/2022, 12:12 PM   Clinical Narrative:    CM received call from Margret Chance with [facility]. Per Cherie patient is set to go to [facility] and transport can be arranged for 3:00pm. Bedside nurse and son Meosha Castanon have been updated. Transport arranged for 3:00pm. Discharge packet in chart at nurses station.   Please call report to [facility]  [phone]   Final next level of care: Hospice Medical Facility Barriers to Discharge: Continued Medical Work up   Patient Goals and CMS Choice      Discharge Placement                         Discharge Plan and Services Additional resources added to the After Visit Summary for     Discharge Planning Services: CM Consult                                 Social Determinants of Health (SDOH) Interventions SDOH Screenings   Tobacco Use: Unknown (01/27/2022)     Readmission Risk Interventions     No data to display

## 2022-02-26 DEATH — deceased
